# Patient Record
Sex: Male | Born: 1955 | Race: White | Hispanic: No | Marital: Married | Smoking: Never smoker
Health system: Southern US, Community
[De-identification: ages and names within clinical notes are randomized; demographics above are authoritative.]

## PROBLEM LIST (undated history)

## (undated) DIAGNOSIS — K760 Fatty (change of) liver, not elsewhere classified: Secondary | ICD-10-CM

## (undated) DIAGNOSIS — K5790 Diverticulosis of intestine, part unspecified, without perforation or abscess without bleeding: Secondary | ICD-10-CM

## (undated) DIAGNOSIS — Z973 Presence of spectacles and contact lenses: Secondary | ICD-10-CM

## (undated) DIAGNOSIS — R011 Cardiac murmur, unspecified: Secondary | ICD-10-CM

## (undated) DIAGNOSIS — J189 Pneumonia, unspecified organism: Secondary | ICD-10-CM

## (undated) DIAGNOSIS — I10 Essential (primary) hypertension: Secondary | ICD-10-CM

## (undated) DIAGNOSIS — K222 Esophageal obstruction: Secondary | ICD-10-CM

## (undated) DIAGNOSIS — Z8719 Personal history of other diseases of the digestive system: Secondary | ICD-10-CM

## (undated) DIAGNOSIS — M109 Gout, unspecified: Secondary | ICD-10-CM

## (undated) DIAGNOSIS — K219 Gastro-esophageal reflux disease without esophagitis: Secondary | ICD-10-CM

## (undated) DIAGNOSIS — F419 Anxiety disorder, unspecified: Secondary | ICD-10-CM

## (undated) DIAGNOSIS — J301 Allergic rhinitis due to pollen: Secondary | ICD-10-CM

## (undated) DIAGNOSIS — Z8709 Personal history of other diseases of the respiratory system: Secondary | ICD-10-CM

## (undated) DIAGNOSIS — E119 Type 2 diabetes mellitus without complications: Secondary | ICD-10-CM

## (undated) DIAGNOSIS — Z87442 Personal history of urinary calculi: Secondary | ICD-10-CM

## (undated) HISTORY — PX: TONSILLECTOMY: SUR1361

## (undated) HISTORY — PX: ESOPHAGEAL DILATION: SHX303

## (undated) HISTORY — PX: COLONOSCOPY: SHX174

---

## 2017-04-22 ENCOUNTER — Encounter (HOSPITAL_COMMUNITY): Payer: Self-pay

## 2017-04-22 ENCOUNTER — Emergency Department (HOSPITAL_COMMUNITY): Payer: BLUE CROSS/BLUE SHIELD

## 2017-04-22 ENCOUNTER — Emergency Department (HOSPITAL_COMMUNITY)
Admission: EM | Admit: 2017-04-22 | Discharge: 2017-04-22 | Disposition: A | Discharge status: Home / Self Care | Payer: BLUE CROSS/BLUE SHIELD | Attending: Emergency Medicine | Admitting: Emergency Medicine

## 2017-04-22 DIAGNOSIS — N200 Calculus of kidney: Secondary | ICD-10-CM | POA: Insufficient documentation

## 2017-04-22 DIAGNOSIS — R1032 Left lower quadrant pain: Secondary | ICD-10-CM | POA: Diagnosis present

## 2017-04-22 DIAGNOSIS — R319 Hematuria, unspecified: Secondary | ICD-10-CM | POA: Diagnosis not present

## 2017-04-22 DIAGNOSIS — R109 Unspecified abdominal pain: Secondary | ICD-10-CM

## 2017-04-22 HISTORY — DX: Allergic rhinitis due to pollen: J30.1

## 2017-04-22 HISTORY — DX: Esophageal obstruction: K22.2

## 2017-04-22 HISTORY — DX: Diverticulosis of intestine, part unspecified, without perforation or abscess without bleeding: K57.90

## 2017-04-22 LAB — URINALYSIS, ROUTINE W REFLEX MICROSCOPIC
Bilirubin Urine: NEGATIVE
Glucose, UA: NEGATIVE mg/dL
KETONES UR: 20 mg/dL — AB
NITRITE: NEGATIVE
PROTEIN: 30 mg/dL — AB
Specific Gravity, Urine: 1.014 (ref 1.005–1.030)
Squamous Epithelial / LPF: NONE SEEN
pH: 5 (ref 5.0–8.0)

## 2017-04-22 LAB — COMPREHENSIVE METABOLIC PANEL
ALK PHOS: 49 U/L (ref 38–126)
ALT: 95 U/L — AB (ref 17–63)
AST: 66 U/L — AB (ref 15–41)
Albumin: 4.2 g/dL (ref 3.5–5.0)
Anion gap: 11 (ref 5–15)
BUN: 15 mg/dL (ref 6–20)
CO2: 21 mmol/L — ABNORMAL LOW (ref 22–32)
CREATININE: 0.91 mg/dL (ref 0.61–1.24)
Calcium: 9.2 mg/dL (ref 8.9–10.3)
Chloride: 107 mmol/L (ref 101–111)
GFR calc non Af Amer: >60 mL/min (ref >60)
Glucose, Bld: 180 mg/dL — ABNORMAL HIGH (ref 65–99)
Potassium: 4.5 mmol/L (ref 3.5–5.1)
SODIUM: 139 mmol/L (ref 135–145)
Total Bilirubin: 1.2 mg/dL (ref 0.3–1.2)
Total Protein: 7.5 g/dL (ref 6.5–8.1)

## 2017-04-22 LAB — CBC WITH DIFFERENTIAL/PLATELET
BASOS ABS: 0 10*3/uL (ref 0.0–0.1)
Basophils Relative: 0 %
EOS ABS: 0 10*3/uL (ref 0.0–0.7)
EOS PCT: 0 %
HCT: 49.6 % (ref 39.0–52.0)
HEMOGLOBIN: 17 g/dL (ref 13.0–17.0)
LYMPHS ABS: 1 10*3/uL (ref 0.7–4.0)
LYMPHS PCT: 10 %
MCH: 30.2 pg (ref 26.0–34.0)
MCHC: 34.3 g/dL (ref 30.0–36.0)
MCV: 88.1 fL (ref 78.0–100.0)
Monocytes Absolute: 0.5 10*3/uL (ref 0.1–1.0)
Monocytes Relative: 5 %
NEUTROS PCT: 85 %
Neutro Abs: 8.2 10*3/uL — ABNORMAL HIGH (ref 1.7–7.7)
PLATELETS: 165 10*3/uL (ref 150–400)
RBC: 5.63 MIL/uL (ref 4.22–5.81)
RDW: 13.5 % (ref 11.5–15.5)
WBC: 9.8 10*3/uL (ref 4.0–10.5)

## 2017-04-22 LAB — LIPASE, BLOOD: LIPASE: 69 U/L — AB (ref 11–51)

## 2017-04-22 LAB — I-STAT CG4 LACTIC ACID, ED
LACTIC ACID, VENOUS: 1.75 mmol/L (ref 0.5–1.9)
LACTIC ACID, VENOUS: 1.75 mmol/L (ref 0.5–1.9)

## 2017-04-22 LAB — PROTIME-INR
INR: 1.09
Prothrombin Time: 14.2 seconds (ref 11.4–15.2)

## 2017-04-22 MED ORDER — TAMSULOSIN HCL 0.4 MG PO CAPS: 0.4000 mg | ORAL_CAPSULE | Freq: Every day | ORAL | 0 refills | Status: DC

## 2017-04-22 MED ORDER — IOPAMIDOL (ISOVUE-300) INJECTION 61%
100.0000 mL | Freq: Once | INTRAVENOUS | Status: AC | PRN
  Administered 2017-04-22: 100 mL via INTRAVENOUS

## 2017-04-22 MED ORDER — OXYCODONE HCL 5 MG PO TABS
5.0000 mg | ORAL_TABLET | Freq: Once | ORAL | Status: AC
  Administered 2017-04-22: 5 mg via ORAL
  Filled 2017-04-22: qty 1

## 2017-04-22 MED ORDER — OXYCODONE HCL 5 MG PO TABS: 5.0000 mg | ORAL_TABLET | ORAL | 0 refills | Status: DC | PRN

## 2017-04-22 MED ORDER — IOPAMIDOL (ISOVUE-300) INJECTION 61%
INTRAVENOUS | Status: AC
  Filled 2017-04-22: qty 100

## 2017-04-22 MED ORDER — ONDANSETRON HCL 4 MG PO TABS: 4.0000 mg | ORAL_TABLET | Freq: Three times a day (TID) | ORAL | 0 refills | Status: DC | PRN

## 2017-04-22 MED ORDER — SODIUM CHLORIDE 0.9 % IV BOLUS (SEPSIS)
1000.0000 mL | Freq: Once | INTRAVENOUS | Status: AC
  Administered 2017-04-22: 1000 mL via INTRAVENOUS

## 2017-04-22 MED ORDER — FENTANYL CITRATE (PF) 100 MCG/2ML IJ SOLN
50.0000 ug | Freq: Once | INTRAMUSCULAR | Status: AC
Start: 2017-04-22 — End: 2017-04-22
  Administered 2017-04-22: 50 ug via INTRAVENOUS
  Filled 2017-04-22: qty 2

## 2017-04-22 NOTE — Discharge Instructions (Signed)
Please take the pain medicine, nausea medicine, and Flomax to help with her symptoms and help pass the kidney stone. Please follow-up with your primary care physician for further management of your elevated liver function testing and your prostate size. Please follow-up with urology and strain your urine to catch the stone. If any symptoms change or worsen, please return to the nearest emergency department.

## 2017-04-22 NOTE — ED Notes (Signed)
ED Provider at bedside. 

## 2017-04-22 NOTE — ED Notes (Signed)
Bed: WA17 Expected date:  Expected time:  Means of arrival:  Comments: Hold for 13

## 2017-04-22 NOTE — ED Notes (Signed)
Pt given ginger ale.

## 2017-04-22 NOTE — ED Triage Notes (Signed)
He c/o left flank pain with "my urine is a little dark" x 2 days. He went to [facility] today; and while he was there he had a "bad pain flare" at left abd./flank area. The doctor there (Dr. Nicholos Johnseade) sent him to hispital with concern of kidney stone vs. Diverticulosis. He arrives in no distress, having rec'd. I.M. Phenergan and I.V. Fentanyl.

## 2017-04-22 NOTE — ED Notes (Signed)
Pt ambulatory to restroom

## 2017-04-22 NOTE — ED Provider Notes (Signed)
[facility]-EMERGENCY DEPT Provider Note   CSN: [id number] Arrival date & time: 04/22/17  1547     History   Chief Complaint Chief Complaint  Patient presents with  . Flank Pain    HPI Kevin Tanner is a 61 y.o. male.  The history is provided by the patient and medical records.  Abdominal Pain   This is a new problem. The current episode started 3 to 5 hours ago. The problem occurs constantly. The problem has been rapidly improving. The pain is located in the LLQ. The quality of the pain is aching and dull. The pain is at a severity of 7/10. The pain is moderate. Associated symptoms include nausea, vomiting and constipation. Pertinent negatives include fever, diarrhea, melena, dysuria, frequency and headaches. The symptoms are aggravated by palpation. Nothing relieves the symptoms.    Past Medical History:  Diagnosis Date  . Diverticulosis   . Esophageal stricture   . Hay fever     There are no active problems to display for this patient.   Past Surgical History:  Procedure Laterality Date  . COLONOSCOPY         Home Medications    Prior to Admission medications   Not on File    Family History No family history on file.  Social History Social History  Substance Use Topics  . Smoking status: Never Smoker  . Smokeless tobacco: Never Used  . Alcohol use Yes     Allergies   Patient has no known allergies.   Review of Systems Review of Systems  Constitutional: Positive for chills, diaphoresis and fatigue. Negative for fever.  HENT: Negative for congestion and rhinorrhea.   Eyes: Negative for visual disturbance.  Respiratory: Negative for cough, chest tightness, shortness of breath, wheezing and stridor.   Cardiovascular: Negative for chest pain, palpitations and leg swelling.  Gastrointestinal: Positive for abdominal pain, constipation, nausea and vomiting. Negative for diarrhea and melena.  Genitourinary: Positive for flank pain. Negative for dysuria and  frequency.  Musculoskeletal: Negative for back pain, neck pain and neck stiffness.  Skin: Negative for rash and wound.  Neurological: Negative for weakness, light-headedness, numbness and headaches.  Psychiatric/Behavioral: Negative for agitation.  All other systems reviewed and are negative.    Physical Exam Updated Vital Signs BP (!) 148/102 (BP Location: Left Arm)   Pulse 66   Temp 98 F (36.7 C) (Oral)   Resp 18   SpO2 100%   Physical Exam  Constitutional: He is oriented to person, place, and time. He appears well-developed and well-nourished. No distress.  HENT:  Head: Normocephalic and atraumatic.  Mouth/Throat: Oropharynx is clear and moist. No oropharyngeal exudate.  Eyes: Pupils are equal, round, and reactive to light. Conjunctivae and EOM are normal. No scleral icterus.  Neck: Normal range of motion. Neck supple.  Cardiovascular: Normal rate and regular rhythm.   No murmur heard. Pulmonary/Chest: Effort normal and breath sounds normal. No respiratory distress. He has no wheezes.  Abdominal: Soft. Normal appearance. He exhibits no distension. There is tenderness in the left upper quadrant and left lower quadrant. There is no rigidity, no guarding and no CVA tenderness.    Musculoskeletal: He exhibits tenderness. He exhibits no edema.       Thoracic back: He exhibits tenderness and pain. He exhibits no bony tenderness.       Back:  Neurological: He is alert and oriented to person, place, and time. No cranial nerve deficit or sensory deficit. He exhibits normal muscle tone.  Skin:  Skin is warm and dry. Capillary refill takes less than 2 seconds. He is not diaphoretic. No erythema.  Psychiatric: He has a normal mood and affect. His behavior is normal.  Nursing note and vitals reviewed.    ED Treatments / Results  Labs (all labs ordered are listed, but only abnormal results are displayed) Labs Reviewed  CBC WITH DIFFERENTIAL/PLATELET - Abnormal; Notable for the  following:       Result Value   Neutro Abs 8.2 (*)    All other components within normal limits  COMPREHENSIVE METABOLIC PANEL - Abnormal; Notable for the following:    CO2 21 (*)    Glucose, Bld 180 (*)    AST 66 (*)    ALT 95 (*)    All other components within normal limits  LIPASE, BLOOD - Abnormal; Notable for the following:    Lipase 69 (*)    All other components within normal limits  URINALYSIS, ROUTINE W REFLEX MICROSCOPIC - Abnormal; Notable for the following:    APPearance HAZY (*)    Hgb urine dipstick LARGE (*)    Ketones, ur 20 (*)    Protein, ur 30 (*)    Leukocytes, UA TRACE (*)    Bacteria, UA RARE (*)    All other components within normal limits  URINE CULTURE  PROTIME-INR  I-STAT CG4 LACTIC ACID, ED  I-STAT CG4 LACTIC ACID, ED    EKG  EKG Interpretation None       Radiology Ct Abdomen Pelvis W Contrast  Result Date: 04/22/2017 CLINICAL DATA:  Left flank pain. EXAM: CT ABDOMEN AND PELVIS WITH CONTRAST TECHNIQUE: Multidetector CT imaging of the abdomen and pelvis was performed using the standard protocol following bolus administration of intravenous contrast. CONTRAST:  <See Chart> ISOVUE-300 IOPAMIDOL (ISOVUE-300) INJECTION 61% COMPARISON:  None. FINDINGS: Lower chest: No acute findings. Hepatobiliary: Liver is low in density suggesting fatty infiltration. Multiple stones within the otherwise normal-appearing gallbladder. No bile duct dilatation seen. Pancreas: Unremarkable. No pancreatic ductal dilatation or surrounding inflammatory changes. Spleen: Normal in size without focal abnormality. Adrenals/Urinary Tract: Adrenal glands appear normal. 6 x 5 mm stone within the proximal left ureter causing mild left-sided hydronephrosis and perinephric edema. Right renal cyst measures 2.6 cm. Bladder is unremarkable. Stomach/Bowel: Bowel is normal in caliber. Extensive diverticulosis of the sigmoid colon without evidence of acute diverticulitis. Additional scattered  diverticulosis within the descending and transverse colon. Appendix is normal. Stomach appears normal. Probable small hiatal hernia. Vascular/Lymphatic: No significant vascular findings are present. No enlarged abdominal or pelvic lymph nodes. Reproductive: Prostate gland is moderately enlarged causing slight mass effect on the bladder base. Other: No abscess collection. No free intraperitoneal air. Small periumbilical abdominal wall hernia which contains fat only. Musculoskeletal: Mild degenerative change in the lumbar spine. No acute or suspicious osseous finding. IMPRESSION: 1. 6 x 5 mm stone within the proximal left ureter, L4 vertebral body level, causing mild left-sided hydronephrosis. 2. Colonic diverticulosis without evidence of acute diverticulitis. 3. Prostate gland is moderately enlarged causing slight mass effect on the bladder base. Recommend correlation with physical exam findings and/or PSA lab values. 4. Fatty infiltration of the liver. 5. Cholelithiasis without evidence of acute cholecystitis. 6. Probable small hiatal hernia. Electronically Signed   By: Bary Richard M.D.   On: 04/22/2017 18:51    Procedures Procedures (including critical care time)  Medications Ordered in ED Medications  sodium chloride 0.9 % bolus 1,000 mL (0 mLs Intravenous Stopped 04/22/17 1900)  iopamidol (ISOVUE-300) 61 %  injection 100 mL (100 mLs Intravenous Contrast Given 04/22/17 1832)  fentaNYL (SUBLIMAZE) injection 50 mcg (50 mcg Intravenous Given 04/22/17 1810)  oxyCODONE (Oxy IR/ROXICODONE) immediate release tablet 5 mg (5 mg Oral Given 04/22/17 2047)     Initial Impression / Assessment and Plan / ED Course  I have reviewed the triage vital signs and the nursing notes.  Pertinent labs & imaging results that were available during my care of the patient were reviewed by me and considered in my medical decision making (see chart for details).     Kevin Tanner is a 61 y.o. male with a past medical history  significant for diverticulosis who presents from his PCP office for left flank and left abdominal pain. Patient reports that he had darkened urine for the last day or 2. He also felt bloated over the last 2 days. He says after leaving church today at approximately 12:30 driving home, he experienced gradual onset of severe left abdominal and left flank pain. He describes the pain is radiating around the side his left flank. He reports several episodes of nausea and nonbloody/nonbilious vomiting. He reports that he had a very small bowel movement this morning and has had some constipation. He denies any rectal bleeding. He denies any groin pain, history of hernias, or traumatic injury. He denies dysuria or foul-smelling urine. He denies any history of kidney stones or prior diverticulitis. He does report a previous colonoscopy revealed diverticulosis. Patient was sent to the emergency department by his PCP for further evaluation.  Patient reports that he received Phenergan and fentanyl with significant improvement in pain. He reports his pain was a 7 out of 10 severity prior to treatment. He is currently 1 out of 10 severity. He denies fevers, chest pain, shortness of breath, cough, or other symptoms. He does say that he had some chills and got diaphoretic when his pain was severe.  On exam, left upper and left lower quadrant were tender to palpation. Left flank slightly tender. No CVA tenderness. Lungs were clear. Chest is nontender. Other abdominal areas nontender. No other significant abnormality is on exam. Symmetrical pulses in upper and lower extremity.   Patient will have screening laboratory testing and urinalysis as well as imaging to look for etiology of discomfort. Suspect constipation versus diverticulitis versus kidney stone versus obstruction. Patient will be given more symptomatic medications if needed while workup was initiated.  8:48 PM Patient given more pain medicine while in the  ED.  Diagnostic testing results are seen above. Lactic acid remained nonelevated. CBC shows no leukocytosis or anemia. CMP showed normal kidney function. Mild elevation of liver function testing. Also slight elevation in lipase. Her analysis showed large hematuria with no nitrates. Given lack of urinary symptoms, doubt UTI. Culture was sent.  CT scan showed kidney stone. A 6 mm x 5 mm. There is mild hydronephrosis. No evidence of diverticulitis or acute cholecystitis. Diverticuli present and gallstones present. Also fatty liver seen which is likely the cause of his LFT elevation. Large prostate seen.  Patient informed of all findings on imaging. Patient will follow-up with PCP for his liver function, fatty liver, and prostate size.  Given the size of the stone and current location mid ureter, patient will be given a trial of medical convulsion therapy with pain medicine, nausea medicine, fluids, and Flomax. Patient will be instructed to strain his urine and follow-up with urology.  Patient was able to tolerate oral food and fluids during challenge. Patient is feeling much better.  Patient given dose of oral pain medicine and agreed with management plan. Patient understood return precautions and follow-up instructions. Patient had no other questions or concerns and was discharged in good condition with plan for trial of outpatient medical expulsion therapy of his kidney stone.   Final Clinical Impressions(s) / ED Diagnoses   Final diagnoses:  Left flank pain  Hematuria, unspecified type  Kidney stone    New Prescriptions New Prescriptions   ONDANSETRON (ZOFRAN) 4 MG TABLET    Take 1 tablet (4 mg total) by mouth every 8 (eight) hours as needed for nausea or vomiting.   OXYCODONE (ROXICODONE) 5 MG IMMEDIATE RELEASE TABLET    Take 1 tablet (5 mg total) by mouth every 4 (four) hours as needed for severe pain.   TAMSULOSIN (FLOMAX) 0.4 MG CAPS CAPSULE    Take 1 capsule (0.4 mg total) by mouth  daily.    Clinical Impression: 1. Left flank pain   2. Hematuria, unspecified type   3. Kidney stone     Disposition: Discharge  Condition: Good  I have discussed the results, Dx and Tx plan with the pt(& family if present). He/she/they expressed understanding and agree(s) with the plan. Discharge instructions discussed at great length. Strict return precautions discussed and pt &/or family have verbalized understanding of the instructions. No further questions at time of discharge.    New Prescriptions   ONDANSETRON (ZOFRAN) 4 MG TABLET    Take 1 tablet (4 mg total) by mouth every 8 (eight) hours as needed for nausea or vomiting.   OXYCODONE (ROXICODONE) 5 MG IMMEDIATE RELEASE TABLET    Take 1 tablet (5 mg total) by mouth every 4 (four) hours as needed for severe pain.   TAMSULOSIN (FLOMAX) 0.4 MG CAPS CAPSULE    Take 1 capsule (0.4 mg total) by mouth daily.    Follow Up: [facility] [address] [place] [id number] [phone] Schedule an appointment as soon as possible for a visit    [facility] [id number] [place] [id number] [phone]  If symptoms worsen     Neytiri Asche, Canary Brimhristopher J, MD 04/23/17 [phone]

## 2017-04-22 NOTE — ED Notes (Signed)
Bed: [id number] Expected date:  Expected time:  Means of arrival:  Comments: 61 yo abd pain

## 2017-04-22 NOTE — ED Notes (Addendum)
Patient transported to CT 

## 2017-04-24 LAB — URINE CULTURE: CULTURE: NO GROWTH

## 2017-05-08 ENCOUNTER — Other Ambulatory Visit: Payer: Self-pay | Admitting: Urology

## 2017-05-09 ENCOUNTER — Other Ambulatory Visit: Payer: Self-pay | Admitting: Urology

## 2017-05-14 ENCOUNTER — Encounter (HOSPITAL_COMMUNITY): Payer: Self-pay | Admitting: *Deleted

## 2017-05-17 ENCOUNTER — Ambulatory Visit (HOSPITAL_COMMUNITY): Payer: BLUE CROSS/BLUE SHIELD

## 2017-05-17 ENCOUNTER — Ambulatory Visit (HOSPITAL_COMMUNITY)
Admission: RE | Admit: 2017-05-17 | Discharge: 2017-05-17 | Disposition: A | Discharge status: Home / Self Care | Payer: BLUE CROSS/BLUE SHIELD | Source: Ambulatory Visit | Attending: Urology | Admitting: Urology

## 2017-05-17 ENCOUNTER — Encounter (HOSPITAL_COMMUNITY): Payer: Self-pay | Admitting: *Deleted

## 2017-05-17 ENCOUNTER — Encounter (HOSPITAL_COMMUNITY)
Admission: RE | Disposition: A | Discharge status: Home / Self Care | Payer: Self-pay | Source: Ambulatory Visit | Attending: Urology

## 2017-05-17 DIAGNOSIS — N201 Calculus of ureter: Secondary | ICD-10-CM

## 2017-05-17 DIAGNOSIS — Z7982 Long term (current) use of aspirin: Secondary | ICD-10-CM | POA: Insufficient documentation

## 2017-05-17 HISTORY — DX: Personal history of other diseases of the digestive system: Z87.19

## 2017-05-17 HISTORY — DX: Personal history of urinary calculi: Z87.442

## 2017-05-17 HISTORY — PX: EXTRACORPOREAL SHOCK WAVE LITHOTRIPSY: SHX1557

## 2017-05-17 HISTORY — DX: Cardiac murmur, unspecified: R01.1

## 2017-05-17 HISTORY — DX: Personal history of other diseases of the respiratory system: Z87.09

## 2017-05-17 HISTORY — DX: Pneumonia, unspecified organism: J18.9

## 2017-05-17 SURGERY — LITHOTRIPSY, ESWL
Anesthesia: LOCAL | Laterality: Left

## 2017-05-17 MED ORDER — DIPHENHYDRAMINE HCL 25 MG PO CAPS
25.0000 mg | ORAL_CAPSULE | ORAL | Status: AC
  Administered 2017-05-17: 25 mg via ORAL
  Filled 2017-05-17: qty 1

## 2017-05-17 MED ORDER — DIAZEPAM 5 MG PO TABS
10.0000 mg | ORAL_TABLET | ORAL | Status: AC
  Administered 2017-05-17: 10 mg via ORAL
  Filled 2017-05-17: qty 2

## 2017-05-17 MED ORDER — SODIUM CHLORIDE 0.9 % IV SOLN
INTRAVENOUS | Status: DC
  Administered 2017-05-17: 07:00:00 via INTRAVENOUS

## 2017-05-17 MED ORDER — CIPROFLOXACIN HCL 500 MG PO TABS
500.0000 mg | ORAL_TABLET | ORAL | Status: AC
  Administered 2017-05-17: 500 mg via ORAL
  Filled 2017-05-17: qty 1

## 2017-05-17 NOTE — Discharge Instructions (Signed)
Moderate Conscious Sedation, Adult, Care After  These instructions provide you with information about caring for yourself after your procedure. Your health care provider may also give you more specific instructions. Your treatment has been planned according to current medical practices, but problems sometimes occur. Call your health care provider if you have any problems or questions after your procedure.  What can I expect after the procedure?  After your procedure, it is common:   To feel sleepy for several hours.   To feel clumsy and have poor balance for several hours.   To have poor judgment for several hours.   To vomit if you eat too soon.    Follow these instructions at home:  For at least 24 hours after the procedure:     Do not:  ? Participate in activities where you could fall or become injured.  ? Drive.  ? Use heavy machinery.  ? Drink alcohol.  ? Take sleeping pills or medicines that cause drowsiness.  ? Make important decisions or sign legal documents.  ? Take care of children on your own.   Rest.  Eating and drinking   Follow the diet recommended by your health care provider.   If you vomit:  ? Drink water, juice, or soup when you can drink without vomiting.  ? Make sure you have little or no nausea before eating solid foods.  General instructions   Have a responsible adult stay with you until you are awake and alert.   Take over-the-counter and prescription medicines only as told by your health care provider.   If you smoke, do not smoke without supervision.   Keep all follow-up visits as told by your health care provider. This is important.  Contact a health care provider if:   You keep feeling nauseous or you keep vomiting.   You feel light-headed.   You develop a rash.   You have a fever.  Get help right away if:   You have trouble breathing.  This information is not intended to replace advice given to you by your health care provider. Make sure you discuss any questions you have  with your health care provider.  Document Released: 06/25/2013 Document Revised: 02/07/2016 Document Reviewed: 12/25/2015  Elsevier Interactive Patient Education  2018 [employer]

## 2017-05-17 NOTE — Progress Notes (Signed)
Lithotripsy canceled due to elevated blood pressure per Dr. Ronne BinningMcKenzie . Instructed to follow up with primary care MD. Mr. Kevin Tanner verbalized understanding.

## 2017-05-22 ENCOUNTER — Other Ambulatory Visit: Payer: Self-pay | Admitting: Urology

## 2017-05-23 ENCOUNTER — Encounter (HOSPITAL_COMMUNITY): Payer: Self-pay | Admitting: *Deleted

## 2017-05-24 ENCOUNTER — Ambulatory Visit (HOSPITAL_COMMUNITY)
Admission: RE | Admit: 2017-05-24 | Discharge: 2017-05-24 | Disposition: A | Discharge status: Home / Self Care | Payer: BLUE CROSS/BLUE SHIELD | Source: Ambulatory Visit | Attending: Urology | Admitting: Urology

## 2017-05-24 ENCOUNTER — Encounter (HOSPITAL_COMMUNITY)
Admission: RE | Disposition: A | Discharge status: Home / Self Care | Payer: Self-pay | Source: Ambulatory Visit | Attending: Urology

## 2017-05-24 ENCOUNTER — Encounter (HOSPITAL_COMMUNITY): Payer: Self-pay | Admitting: General Practice

## 2017-05-24 ENCOUNTER — Ambulatory Visit (HOSPITAL_COMMUNITY): Payer: BLUE CROSS/BLUE SHIELD

## 2017-05-24 DIAGNOSIS — Z87442 Personal history of urinary calculi: Secondary | ICD-10-CM | POA: Insufficient documentation

## 2017-05-24 DIAGNOSIS — E119 Type 2 diabetes mellitus without complications: Secondary | ICD-10-CM | POA: Diagnosis not present

## 2017-05-24 DIAGNOSIS — E669 Obesity, unspecified: Secondary | ICD-10-CM | POA: Insufficient documentation

## 2017-05-24 DIAGNOSIS — N201 Calculus of ureter: Secondary | ICD-10-CM | POA: Diagnosis not present

## 2017-05-24 DIAGNOSIS — I10 Essential (primary) hypertension: Secondary | ICD-10-CM | POA: Diagnosis not present

## 2017-05-24 DIAGNOSIS — Z6835 Body mass index (BMI) 35.0-35.9, adult: Secondary | ICD-10-CM | POA: Diagnosis not present

## 2017-05-24 HISTORY — PX: EXTRACORPOREAL SHOCK WAVE LITHOTRIPSY: SHX1557

## 2017-05-24 HISTORY — DX: Type 2 diabetes mellitus without complications: E11.9

## 2017-05-24 HISTORY — DX: Essential (primary) hypertension: I10

## 2017-05-24 LAB — GLUCOSE, CAPILLARY: GLUCOSE-CAPILLARY: 122 mg/dL — AB (ref 65–99)

## 2017-05-24 SURGERY — LITHOTRIPSY, ESWL
Anesthesia: LOCAL | Laterality: Left

## 2017-05-24 MED ORDER — DIPHENHYDRAMINE HCL 25 MG PO CAPS
25.0000 mg | ORAL_CAPSULE | ORAL | Status: AC
Start: 2017-05-24 — End: 2017-05-24
  Administered 2017-05-24: 25 mg via ORAL
  Filled 2017-05-24: qty 1

## 2017-05-24 MED ORDER — CIPROFLOXACIN HCL 500 MG PO TABS
500.0000 mg | ORAL_TABLET | ORAL | Status: AC
  Administered 2017-05-24: 500 mg via ORAL
  Filled 2017-05-24 (×2): qty 1

## 2017-05-24 MED ORDER — SODIUM CHLORIDE 0.9 % IV SOLN
INTRAVENOUS | Status: DC
  Administered 2017-05-24: 15:00:00 via INTRAVENOUS

## 2017-05-24 MED ORDER — DIAZEPAM 5 MG PO TABS
10.0000 mg | ORAL_TABLET | ORAL | Status: AC
  Administered 2017-05-24: 10 mg via ORAL
  Filled 2017-05-24: qty 2

## 2017-05-24 NOTE — Progress Notes (Signed)
Procedure canceled due to stone in bladder. Patient instructed to drink lots of fluids to pass stone, follow-up with doctor with any problems, patient and patient's wife verbalized understanding.

## 2017-06-12 ENCOUNTER — Other Ambulatory Visit: Payer: Self-pay | Admitting: Urology

## 2017-06-14 HISTORY — PX: CYSTOSCOPY: SUR368

## 2017-06-18 ENCOUNTER — Other Ambulatory Visit: Payer: Self-pay | Admitting: Urology

## 2017-06-19 NOTE — H&P (Signed)
Urology Admission H&P  Chief Complaint: left flank pain  History of Present Illness: [name] is a 61yo with a hx of left ureteral calculus who has failed medical expulsive therapy  Past Medical History:  Diagnosis Date  . Diabetes mellitus without complication (HCC)    not on medication yet- just got told  05/24/17  . Diverticulosis   . Esophageal stricture   . Hay fever   . Heart murmur    grade 1  . History of bronchitis   . History of hiatal hernia   . History of kidney stones   . Hypertension    not on med yet  . Pneumonia    bacterial in early 20's    Past Surgical History:  Procedure Laterality Date  . COLONOSCOPY    . EXTRACORPOREAL SHOCK WAVE LITHOTRIPSY Left 05/17/2017   Procedure: LEFT EXTRACORPOREAL SHOCK WAVE LITHOTRIPSY (ESWL);  Surgeon: Malen Gauze, MD;  Location: [facility];  Service: Urology;  Laterality: Left;  . EXTRACORPOREAL SHOCK WAVE LITHOTRIPSY Left 05/24/2017   Procedure: LEFT EXTRACORPOREAL SHOCK WAVE LITHOTRIPSY (ESWL);  Surgeon: Malen Gauze, MD;  Location: [facility];  Service: Urology;  Laterality: Left;  . TONSILLECTOMY     age 61    Home Medications:  No prescriptions prior to admission.   Allergies: No Known Allergies  History reviewed. No pertinent family history. Social History:  reports that he has never smoked. He has never used smokeless tobacco. He reports that he drinks alcohol. He reports that he does not use drugs.  Review of Systems  Genitourinary: Positive for flank pain.  All other systems reviewed and are negative.   Physical Exam:  Vital signs in last 24 hours:   Physical Exam  Constitutional: He is oriented to person, place, and time. He appears well-developed and well-nourished.  HENT:  Head: Normocephalic and atraumatic.  Eyes: Pupils are equal, round, and reactive to light. EOM are normal.  Neck: Normal range of motion. No thyromegaly present.  Cardiovascular: Normal rate and regular rhythm.   Respiratory:  Effort normal. No respiratory distress.  GI: Soft. He exhibits no distension.  Musculoskeletal: Normal range of motion. He exhibits no edema.  Neurological: He is alert and oriented to person, place, and time.  Skin: Skin is warm and dry.  Psychiatric: He has a normal mood and affect. His behavior is normal. Judgment and thought content normal.    Laboratory Data:  No results found for this or any previous visit (from the past 24 hour(s)). No results found for this or any previous visit (from the past 240 hour(s)). Creatinine: No results for input(s): CREATININE in the last 168 hours. Baseline Creatinine: unknwon  Impression/Assessment:  61yo with left ureteral calculus  Plan:  The risks/benefits/alternatives to ESWL was explained to the patient and he understands and wishes to proceed with surgery  [name] 06/19/2017, 7:46 AM

## 2017-06-21 ENCOUNTER — Encounter (HOSPITAL_BASED_OUTPATIENT_CLINIC_OR_DEPARTMENT_OTHER): Payer: Self-pay | Admitting: *Deleted

## 2017-06-25 ENCOUNTER — Other Ambulatory Visit: Payer: Self-pay

## 2017-06-25 ENCOUNTER — Encounter (HOSPITAL_BASED_OUTPATIENT_CLINIC_OR_DEPARTMENT_OTHER): Payer: Self-pay | Admitting: Anesthesiology

## 2017-06-25 ENCOUNTER — Encounter (HOSPITAL_BASED_OUTPATIENT_CLINIC_OR_DEPARTMENT_OTHER)
Admission: RE | Disposition: A | Discharge status: Home / Self Care | Payer: Self-pay | Source: Ambulatory Visit | Attending: Urology

## 2017-06-25 ENCOUNTER — Ambulatory Visit (HOSPITAL_BASED_OUTPATIENT_CLINIC_OR_DEPARTMENT_OTHER): Payer: BLUE CROSS/BLUE SHIELD | Admitting: Anesthesiology

## 2017-06-25 ENCOUNTER — Ambulatory Visit (HOSPITAL_BASED_OUTPATIENT_CLINIC_OR_DEPARTMENT_OTHER)
Admission: RE | Admit: 2017-06-25 | Discharge: 2017-06-25 | Disposition: A | Discharge status: Home / Self Care | Payer: BLUE CROSS/BLUE SHIELD | Source: Ambulatory Visit | Attending: Urology | Admitting: Urology

## 2017-06-25 DIAGNOSIS — N401 Enlarged prostate with lower urinary tract symptoms: Secondary | ICD-10-CM | POA: Diagnosis present

## 2017-06-25 DIAGNOSIS — N210 Calculus in bladder: Secondary | ICD-10-CM | POA: Insufficient documentation

## 2017-06-25 DIAGNOSIS — F419 Anxiety disorder, unspecified: Secondary | ICD-10-CM | POA: Insufficient documentation

## 2017-06-25 DIAGNOSIS — N138 Other obstructive and reflux uropathy: Secondary | ICD-10-CM | POA: Insufficient documentation

## 2017-06-25 DIAGNOSIS — Z7984 Long term (current) use of oral hypoglycemic drugs: Secondary | ICD-10-CM | POA: Diagnosis not present

## 2017-06-25 DIAGNOSIS — R338 Other retention of urine: Secondary | ICD-10-CM | POA: Insufficient documentation

## 2017-06-25 DIAGNOSIS — Z87442 Personal history of urinary calculi: Secondary | ICD-10-CM | POA: Insufficient documentation

## 2017-06-25 DIAGNOSIS — E119 Type 2 diabetes mellitus without complications: Secondary | ICD-10-CM | POA: Insufficient documentation

## 2017-06-25 DIAGNOSIS — K219 Gastro-esophageal reflux disease without esophagitis: Secondary | ICD-10-CM | POA: Diagnosis not present

## 2017-06-25 DIAGNOSIS — I10 Essential (primary) hypertension: Secondary | ICD-10-CM | POA: Diagnosis not present

## 2017-06-25 DIAGNOSIS — Z79899 Other long term (current) drug therapy: Secondary | ICD-10-CM | POA: Insufficient documentation

## 2017-06-25 DIAGNOSIS — R011 Cardiac murmur, unspecified: Secondary | ICD-10-CM | POA: Diagnosis not present

## 2017-06-25 HISTORY — PX: CYSTOSCOPY WITH LITHOLAPAXY: SHX1425

## 2017-06-25 HISTORY — DX: Anxiety disorder, unspecified: F41.9

## 2017-06-25 HISTORY — PX: CYSTOSCOPY WITH INSERTION OF UROLIFT: SHX6678

## 2017-06-25 HISTORY — PX: CYSTOSCOPY: SHX5120

## 2017-06-25 HISTORY — PX: HOLMIUM LASER APPLICATION: SHX5852

## 2017-06-25 HISTORY — DX: Gastro-esophageal reflux disease without esophagitis: K21.9

## 2017-06-25 LAB — POCT I-STAT, CHEM 8
BUN: 16 mg/dL (ref 6–20)
CALCIUM ION: 1.19 mmol/L (ref 1.15–1.40)
CHLORIDE: 102 mmol/L (ref 101–111)
CREATININE: 0.9 mg/dL (ref 0.61–1.24)
GLUCOSE: 126 mg/dL — AB (ref 65–99)
HCT: 53 % — ABNORMAL HIGH (ref 39.0–52.0)
Hemoglobin: 18 g/dL — ABNORMAL HIGH (ref 13.0–17.0)
Potassium: 3.8 mmol/L (ref 3.5–5.1)
SODIUM: 142 mmol/L (ref 135–145)
TCO2: 27 mmol/L (ref 22–32)

## 2017-06-25 LAB — GLUCOSE, CAPILLARY: Glucose-Capillary: 154 mg/dL — ABNORMAL HIGH (ref 65–99)

## 2017-06-25 SURGERY — CYSTOSCOPY, WITH BLADDER CALCULUS LITHOLAPAXY
Anesthesia: General

## 2017-06-25 MED ORDER — LACTATED RINGERS IV SOLN
INTRAVENOUS | Status: DC
  Administered 2017-06-25: 14:00:00 via INTRAVENOUS
  Filled 2017-06-25: qty 1000

## 2017-06-25 MED ORDER — PROMETHAZINE HCL 25 MG/ML IJ SOLN
6.2500 mg | INTRAMUSCULAR | Status: DC | PRN
  Filled 2017-06-25: qty 1

## 2017-06-25 MED ORDER — MIDAZOLAM HCL 2 MG/2ML IJ SOLN
INTRAMUSCULAR | Status: DC | PRN
  Administered 2017-06-25: 2 mg via INTRAVENOUS

## 2017-06-25 MED ORDER — KETOROLAC TROMETHAMINE 30 MG/ML IJ SOLN
INTRAMUSCULAR | Status: DC | PRN
  Administered 2017-06-25: 30 mg via INTRAVENOUS

## 2017-06-25 MED ORDER — OXYCODONE HCL 5 MG/5ML PO SOLN
5.0000 mg | Freq: Once | ORAL | Status: AC | PRN
  Filled 2017-06-25: qty 5

## 2017-06-25 MED ORDER — OXYCODONE HCL 5 MG PO TABS: 5.0000 mg | ORAL_TABLET | ORAL | 0 refills | Status: DC | PRN

## 2017-06-25 MED ORDER — ONDANSETRON HCL 4 MG/2ML IJ SOLN
INTRAMUSCULAR | Status: DC | PRN
  Administered 2017-06-25: 4 mg via INTRAVENOUS

## 2017-06-25 MED ORDER — FENTANYL CITRATE (PF) 100 MCG/2ML IJ SOLN
INTRAMUSCULAR | Status: DC | PRN
  Administered 2017-06-25 (×2): 50 ug via INTRAVENOUS

## 2017-06-25 MED ORDER — PROPOFOL 10 MG/ML IV BOLUS
INTRAVENOUS | Status: DC | PRN
  Administered 2017-06-25: 200 mg via INTRAVENOUS

## 2017-06-25 MED ORDER — CEFAZOLIN SODIUM-DEXTROSE 2-4 GM/100ML-% IV SOLN
2.0000 g | INTRAVENOUS | Status: AC
  Administered 2017-06-25: 2 g via INTRAVENOUS
  Filled 2017-06-25: qty 100

## 2017-06-25 MED ORDER — HYDROMORPHONE HCL 1 MG/ML IJ SOLN
0.2500 mg | INTRAMUSCULAR | Status: DC | PRN
  Filled 2017-06-25: qty 0.5

## 2017-06-25 MED ORDER — DEXAMETHASONE SODIUM PHOSPHATE 10 MG/ML IJ SOLN
INTRAMUSCULAR | Status: DC | PRN
  Administered 2017-06-25: 10 mg via INTRAVENOUS

## 2017-06-25 MED ORDER — LIDOCAINE 2% (20 MG/ML) 5 ML SYRINGE
INTRAMUSCULAR | Status: DC | PRN
  Administered 2017-06-25: 80 mg via INTRAVENOUS

## 2017-06-25 MED ORDER — OXYCODONE HCL 5 MG PO TABS
5.0000 mg | ORAL_TABLET | Freq: Once | ORAL | Status: AC | PRN
  Administered 2017-06-25: 5 mg via ORAL
  Filled 2017-06-25: qty 1

## 2017-06-25 SURGICAL SUPPLY — 33 items
ADAPTER CATH WHT DISP STRL (CATHETERS) IMPLANT
BAG DRAIN URO-CYSTO SKYTR STRL (DRAIN) ×3 IMPLANT
BAG URINE DRAINAGE (UROLOGICAL SUPPLIES) IMPLANT
BAG URINE LEG 19OZ MD ST LTX (BAG) ×3 IMPLANT
CATH FOLEY 2WAY SLVR  5CC 16FR (CATHETERS)
CATH FOLEY 2WAY SLVR  5CC 18FR (CATHETERS) ×2
CATH FOLEY 2WAY SLVR 5CC 16FR (CATHETERS) IMPLANT
CATH FOLEY 2WAY SLVR 5CC 18FR (CATHETERS) ×1 IMPLANT
CLOTH BEACON ORANGE TIMEOUT ST (SAFETY) ×3 IMPLANT
ELECT REM PT RETURN 9FT ADLT (ELECTROSURGICAL)
ELECTRODE REM PT RTRN 9FT ADLT (ELECTROSURGICAL) IMPLANT
EVACUATOR MICROVAS BLADDER (UROLOGICAL SUPPLIES) IMPLANT
FIBER LASER FLEXIVA 1000 (UROLOGICAL SUPPLIES) IMPLANT
FIBER LASER FLEXIVA 200 (UROLOGICAL SUPPLIES) IMPLANT
FIBER LASER FLEXIVA 365 (UROLOGICAL SUPPLIES) IMPLANT
FIBER LASER FLEXIVA 550 (UROLOGICAL SUPPLIES) IMPLANT
GLOVE BIO SURGEON STRL SZ8 (GLOVE) ×3 IMPLANT
GOWN STRL REUS W/ TWL LRG LVL3 (GOWN DISPOSABLE) ×1 IMPLANT
GOWN STRL REUS W/ TWL XL LVL3 (GOWN DISPOSABLE) ×1 IMPLANT
GOWN STRL REUS W/TWL LRG LVL3 (GOWN DISPOSABLE) ×2
GOWN STRL REUS W/TWL XL LVL3 (GOWN DISPOSABLE) ×5 IMPLANT
HOLDER FOLEY CATH W/STRAP (MISCELLANEOUS) ×3 IMPLANT
IV NS IRRIG 3000ML ARTHROMATIC (IV SOLUTION) IMPLANT
KIT RM TURNOVER CYSTO AR (KITS) ×3 IMPLANT
MANIFOLD NEPTUNE II (INSTRUMENTS) ×3 IMPLANT
NEEDLE SPNL 22GX7 QUINCKE BK (NEEDLE) IMPLANT
NS IRRIG 500ML POUR BTL (IV SOLUTION) IMPLANT
PACK CYSTO (CUSTOM PROCEDURE TRAY) ×3 IMPLANT
SYR 20CC LL (SYRINGE) ×6 IMPLANT
SYSTEM UROLIFT (Male Continence) ×15 IMPLANT
TUBE CONNECTING 12'X1/4 (SUCTIONS) ×1
TUBE CONNECTING 12X1/4 (SUCTIONS) ×2 IMPLANT
WATER STERILE IRR 3000ML UROMA (IV SOLUTION) ×6 IMPLANT

## 2017-06-25 NOTE — Op Note (Signed)
   PREOPERATIVE DIAGNOSIS: Benign prostatic hypertrophy with bladder outlet obstruction. Bladder calculus  POSTOPERATIVE DIAGNOSIS: Benign prostatic hypertrophy with bladder outlet obstruction. Bladder calculus  PROCEDURE:1. Cystoscopy with implantation of UroLift devices, 5 implants. 2. cystolithalopaxy for a stone less than 2.5cm  SURGEON: [name], M.D.  ANESTHESIA: General  ANTIBIOTICS: ancef  SPECIMEN: None.  DRAINS: A 18-French Foley catheter.  BLOOD LOSS: Minimal.  COMPLICATIONS: None.  INDICATIONS:The Patient is an 61 year old white male with BPH and bladder outlet obstruction. He has a bladder calculus also He has failed medical therapy and has elected UroLift for definitive treatment as well as cystolithalopaxy  FINDINGS OF PROCEDURE: He was taken to the operating room where a genral anesthetic was induced. He was placed in lithotomy position and was fitted with PAS hose. His perineum and genitalia were prepped with chlorhexidine, and he was draped in usual sterile fashion.  Cystoscopy was performed using the UroLift scope and 0 degree lens. Examination revealed a normal urethra. The external sphincter was intact. Prostatic urethra was approximately 4 cm in length with lateral lobe enlargement. There was also little bit of bladder neck elevation. Inspection of bladder revealed mild-to-moderate trabeculation with no tumors, or inflammation. No cellules or diverticula were noted. Ureteral orifices were in their normal anatomic position effluxing clear Urine. Using a grasper the bladder calculus was manipulated and pulled fromt he bladder.  After initial cystoscopy, the visual obturator was replaced with the first UroLift device. This was turned to the 9 o'clock position and pulled back to the veru and then slightly advanced. Pressure was then applied to the right lateral lobe and the UroLift device was deployed.  The second UroLift  device was then inserted and applied to the left lateral lobe at 3 o'clock and deployed in the mid prostatic urethra. After this, there was still some apparent obstruction closer to the bladder neck. So a second level of UroLift your left device was applied between the mid urethra and the proximal urethra providing further patency to the prostatic urethra. At this point, there was mild bleeding but the patient did have a spinal anesthetic. So it was thought that a Foley catheter was indicated. The scope was removed and a 16-French Foley catheter was inserted without difficulty. The balloon was filled with 10 mL sterile fluid, and the catheter was placed to straight drainage.  COMPLICATIONS: None   CONDITION: Stable, extubated, transferred to PACU  PLAN: The patient will be discharged home and followup in 2 days for a voiding trial.

## 2017-06-25 NOTE — Anesthesia Procedure Notes (Signed)
Procedure Name: LMA Insertion Date/Time: 06/25/2017 2:39 PM Performed by: Tyrone Nine Pre-anesthesia Checklist: Patient identified, Timeout performed, Emergency Drugs available, Suction available and Patient being monitored Patient Re-evaluated:Patient Re-evaluated prior to induction Oxygen Delivery Method: Circle system utilized Preoxygenation: Pre-oxygenation with 100% oxygen Induction Type: IV induction Ventilation: Mask ventilation without difficulty LMA: LMA inserted LMA Size: 5.0 Placement Confirmation: positive ETCO2 and breath sounds checked- equal and bilateral Tube secured with: Tape Dental Injury: Teeth and Oropharynx as per pre-operative assessment

## 2017-06-25 NOTE — Transfer of Care (Signed)
Immediate Anesthesia Transfer of Care Note  Patient: Kevin Tanner  Procedure(s) Performed: CYSTOSCOPY WITH LITHOLAPAXY (N/A ) HOLMIUM LASER APPLICATION (N/A ) CYSTOSCOPY (N/A ) CYSTOSCOPY WITH INSERTION OF UROLIFT (N/A )  Patient Location: PACU  Anesthesia Type:General  Level of Consciousness: awake, alert , oriented and patient cooperative  Airway & Oxygen Therapy: Patient Spontanous Breathing and Patient connected to nasal cannula oxygen  Post-op Assessment: Report given to RN and Post -op Vital signs reviewed and stable  Post vital signs: Reviewed and stable  Last Vitals:  Vitals:   06/25/17 1246  BP: (!) 141/97  Pulse: 93  Resp: 19  Temp: 36.7 C  SpO2: 96%    Last Pain:  Vitals:   06/25/17 1246  TempSrc: Oral         Complications: No apparent anesthesia complications

## 2017-06-25 NOTE — Anesthesia Preprocedure Evaluation (Addendum)
Anesthesia Evaluation  Patient identified by MRN, date of birth, ID band Patient awake    Reviewed: Allergy & Precautions, NPO status , Patient's Chart, lab work & pertinent test results  Airway Mallampati: II  TM Distance: >3 FB Neck ROM: Full    Dental no notable dental hx. (+) Teeth Intact, Dental Advisory Given   Pulmonary neg pulmonary ROS,    Pulmonary exam normal breath sounds clear to auscultation       Cardiovascular Exercise Tolerance: Good hypertension, Pt. on medications negative cardio ROS Normal cardiovascular exam+ Valvular Problems/Murmurs  Rhythm:Regular Rate:Normal     Neuro/Psych Anxiety negative neurological ROS  negative psych ROS   GI/Hepatic negative GI ROS, Neg liver ROS, GERD  Medicated and Controlled,  Endo/Other  negative endocrine ROSdiabetes, Type 2, Oral Hypoglycemic Agents  Renal/GU negative Renal ROS  negative genitourinary   Musculoskeletal negative musculoskeletal ROS (+)   Abdominal   Peds negative pediatric ROS (+)  Hematology negative hematology ROS (+)   Anesthesia Other Findings   Reproductive/Obstetrics negative OB ROS                           Anesthesia Physical Anesthesia Plan  ASA: III  Anesthesia Plan: General   Post-op Pain Management:    Induction: Intravenous  PONV Risk Score and Plan: 2 and Ondansetron and Midazolam  Airway Management Planned: LMA  Additional Equipment:   Intra-op Plan:   Post-operative Plan: Extubation in OR  Informed Consent: I have reviewed the patients History and Physical, chart, labs and discussed the procedure including the risks, benefits and alternatives for the proposed anesthesia with the patient or authorized representative who has indicated his/her understanding and acceptance.   Dental advisory given  Plan Discussed with: CRNA  Anesthesia Plan Comments:         Anesthesia Quick  Evaluation

## 2017-06-25 NOTE — Discharge Instructions (Signed)

## 2017-06-25 NOTE — H&P (Signed)
Urology Admission H&P  Chief Complaint: bladder calculus  History of Present Illness: Kevin Tanner is a 61yo with BPH and incomplete emptying. His has a bladder calculus which he has been unable to pass  Past Medical History:  Diagnosis Date  . Anxiety   . Diabetes mellitus without complication (HCC)    type 2   . Diverticulosis   . Esophageal stricture   . GERD (gastroesophageal reflux disease)   . Hay fever   . Heart murmur    grade 1  . History of bronchitis   . History of hiatal hernia   . History of kidney stones   . Hypertension    not on med yet  . Pneumonia    bacterial in early 20's    Past Surgical History:  Procedure Laterality Date  . COLONOSCOPY    . CYSTOSCOPY  06/14/2017   in office  . EXTRACORPOREAL SHOCK WAVE LITHOTRIPSY Left 05/17/2017   Procedure: LEFT EXTRACORPOREAL SHOCK WAVE LITHOTRIPSY (ESWL);  Surgeon: Malen Gauze, MD;  Location: [facility];  Service: Urology;  Laterality: Left;  . EXTRACORPOREAL SHOCK WAVE LITHOTRIPSY Left 05/24/2017   Procedure: LEFT EXTRACORPOREAL SHOCK WAVE LITHOTRIPSY (ESWL);  Surgeon: Malen Gauze, MD;  Location: [facility];  Service: Urology;  Laterality: Left;  . TONSILLECTOMY     age 22    Home Medications:  Prescriptions Prior to Admission  Medication Sig Dispense Refill Last Dose  . hydrochlorothiazide (HYDRODIURIL) 12.5 MG tablet Take 12.5 mg by mouth daily.   06/24/2017 at 2300  . metFORMIN (GLUCOPHAGE) 500 MG tablet Take by mouth every evening.   06/24/2017 at 1230  . tamsulosin (FLOMAX) 0.4 MG CAPS capsule Take 1 capsule (0.4 mg total) by mouth daily. 7 capsule 0 2300 at Unknown time  . Multiple Vitamin (MULTIVITAMIN WITH MINERALS) TABS tablet Take 1 tablet by mouth daily.   More than a month at Unknown time  . ondansetron (ZOFRAN) 4 MG tablet Take 1 tablet (4 mg total) by mouth every 8 (eight) hours as needed for nausea or vomiting. 12 tablet 0 has not taken  . oxyCODONE (ROXICODONE) 5 MG immediate release tablet  Take 1 tablet (5 mg total) by mouth every 4 (four) hours as needed for severe pain. 20 tablet 0 Past Month at Unknown time  . Vitamin D, Cholecalciferol, 1000 units CAPS Take 4,000 Units by mouth daily.   More than a month at Unknown time   Allergies: No Known Allergies  No family history on file. Social History:  reports that he has never smoked. He has never used smokeless tobacco. He reports that he drinks alcohol. He reports that he does not use drugs.  Review of Systems  Genitourinary: Positive for dysuria, frequency and urgency.  All other systems reviewed and are negative.   Physical Exam:  Vital signs in last 24 hours: Temp:  [98 F (36.7 C)] 98 F (36.7 C) (10/08 1246) Pulse Rate:  [93] 93 (10/08 1246) Resp:  [19] 19 (10/08 1246) BP: (141)/(97) 141/97 (10/08 1246) SpO2:  [96 %] 96 % (10/08 1246) Weight:  [98.9 kg (218 lb)] 98.9 kg (218 lb) (10/08 1246) Physical Exam  Constitutional: He is oriented to person, place, and time. He appears well-developed and well-nourished.  HENT:  Head: Normocephalic and atraumatic.  Eyes: Pupils are equal, round, and reactive to light. EOM are normal.  Neck: Normal range of motion. No thyromegaly present.  Cardiovascular: Normal rate and regular rhythm.   Respiratory: Effort normal. No respiratory distress.  GI: Soft. He exhibits no distension.  Musculoskeletal: Normal range of motion. He exhibits no edema.  Neurological: He is alert and oriented to person, place, and time.  Skin: Skin is warm and dry.  Psychiatric: He has a normal mood and affect. His behavior is normal. Judgment and thought content normal.    Laboratory Data:  No results found for this or any previous visit (from the past 24 hour(s)). No results found for this or any previous visit (from the past 240 hour(s)). Creatinine: No results for input(s): CREATININE in the last 168 hours. Baseline Creatinine: unknwon  Impression/Assessment:  61yo with BPH with incomplete  emptying, bladder calculus  Plan:  The risks/benefits/alternatives to cystolithalopaxy and urolift was explained to the patient and he understands and wishes to proceed with surgery  [name] 06/25/2017, 1:28 PM

## 2017-06-26 ENCOUNTER — Encounter (HOSPITAL_BASED_OUTPATIENT_CLINIC_OR_DEPARTMENT_OTHER): Payer: Self-pay | Admitting: Urology

## 2017-06-26 NOTE — Anesthesia Postprocedure Evaluation (Signed)
Anesthesia Post Note  Patient: Kevin Tanner  Procedure(s) Performed: CYSTOSCOPY WITH LITHOLAPAXY (N/A ) HOLMIUM LASER APPLICATION (N/A ) CYSTOSCOPY (N/A ) CYSTOSCOPY WITH INSERTION OF UROLIFT (N/A )     Anesthesia Post Evaluation  Last Vitals:  Vitals:   06/25/17 1545 06/25/17 1645  BP: 130/85 (!) 142/80  Pulse: 70 70  Resp: 14 14  Temp:  36.6 C  SpO2: 97% 97%    Last Pain:  Vitals:   06/26/17 1356  TempSrc:   PainSc: 2                  Lowella Curb

## 2017-09-18 DIAGNOSIS — Z8709 Personal history of other diseases of the respiratory system: Secondary | ICD-10-CM

## 2017-09-18 HISTORY — DX: Personal history of other diseases of the respiratory system: Z87.09

## 2018-02-07 ENCOUNTER — Emergency Department (HOSPITAL_COMMUNITY)
Admission: EM | Admit: 2018-02-07 | Discharge: 2018-02-07 | Disposition: A | Discharge status: Home / Self Care | Payer: BLUE CROSS/BLUE SHIELD | Attending: Emergency Medicine | Admitting: Emergency Medicine

## 2018-02-07 ENCOUNTER — Other Ambulatory Visit: Payer: Self-pay

## 2018-02-07 ENCOUNTER — Encounter (HOSPITAL_COMMUNITY): Payer: Self-pay | Admitting: Emergency Medicine

## 2018-02-07 DIAGNOSIS — E119 Type 2 diabetes mellitus without complications: Secondary | ICD-10-CM | POA: Insufficient documentation

## 2018-02-07 DIAGNOSIS — Z7984 Long term (current) use of oral hypoglycemic drugs: Secondary | ICD-10-CM | POA: Diagnosis not present

## 2018-02-07 DIAGNOSIS — I10 Essential (primary) hypertension: Secondary | ICD-10-CM | POA: Insufficient documentation

## 2018-02-07 DIAGNOSIS — R1110 Vomiting, unspecified: Secondary | ICD-10-CM | POA: Diagnosis not present

## 2018-02-07 DIAGNOSIS — K529 Noninfective gastroenteritis and colitis, unspecified: Secondary | ICD-10-CM

## 2018-02-07 DIAGNOSIS — Z8679 Personal history of other diseases of the circulatory system: Secondary | ICD-10-CM | POA: Diagnosis not present

## 2018-02-07 DIAGNOSIS — R197 Diarrhea, unspecified: Secondary | ICD-10-CM | POA: Diagnosis present

## 2018-02-07 LAB — COMPREHENSIVE METABOLIC PANEL
ALK PHOS: 51 U/L (ref 38–126)
ALT: 47 U/L (ref 17–63)
AST: 33 U/L (ref 15–41)
Albumin: 4.5 g/dL (ref 3.5–5.0)
Anion gap: 13 (ref 5–15)
BILIRUBIN TOTAL: 1 mg/dL (ref 0.3–1.2)
BUN: 18 mg/dL (ref 6–20)
CALCIUM: 9.6 mg/dL (ref 8.9–10.3)
CO2: 24 mmol/L (ref 22–32)
CREATININE: 1.09 mg/dL (ref 0.61–1.24)
Chloride: 101 mmol/L (ref 101–111)
GFR calc Af Amer: >60 mL/min (ref >60)
Glucose, Bld: 194 mg/dL — ABNORMAL HIGH (ref 65–99)
Potassium: 3.8 mmol/L (ref 3.5–5.1)
Sodium: 138 mmol/L (ref 135–145)
TOTAL PROTEIN: 7.7 g/dL (ref 6.5–8.1)

## 2018-02-07 LAB — URINALYSIS, ROUTINE W REFLEX MICROSCOPIC
BILIRUBIN URINE: NEGATIVE
GLUCOSE, UA: NEGATIVE mg/dL
Hgb urine dipstick: NEGATIVE
KETONES UR: 5 mg/dL — AB
LEUKOCYTES UA: NEGATIVE
NITRITE: NEGATIVE
PH: 5 (ref 5.0–8.0)
Protein, ur: 30 mg/dL — AB
SPECIFIC GRAVITY, URINE: 1.019 (ref 1.005–1.030)

## 2018-02-07 LAB — CBC
HCT: 50.4 % (ref 39.0–52.0)
Hemoglobin: 17.6 g/dL — ABNORMAL HIGH (ref 13.0–17.0)
MCH: 30.7 pg (ref 26.0–34.0)
MCHC: 34.9 g/dL (ref 30.0–36.0)
MCV: 87.8 fL (ref 78.0–100.0)
PLATELETS: 189 10*3/uL (ref 150–400)
RBC: 5.74 MIL/uL (ref 4.22–5.81)
RDW: 13.2 % (ref 11.5–15.5)
WBC: 11.9 10*3/uL — AB (ref 4.0–10.5)

## 2018-02-07 LAB — LIPASE, BLOOD: Lipase: 43 U/L (ref 11–51)

## 2018-02-07 MED ORDER — ONDANSETRON 4 MG PO TBDP
ORAL_TABLET | ORAL | Status: AC
  Filled 2018-02-07: qty 1

## 2018-02-07 MED ORDER — ONDANSETRON HCL 4 MG/2ML IJ SOLN
4.0000 mg | Freq: Once | INTRAMUSCULAR | Status: AC
  Administered 2018-02-07: 4 mg via INTRAVENOUS
  Filled 2018-02-07: qty 2

## 2018-02-07 MED ORDER — SODIUM CHLORIDE 0.9 % IV BOLUS
1000.0000 mL | Freq: Once | INTRAVENOUS | Status: AC
  Administered 2018-02-07: 1000 mL via INTRAVENOUS

## 2018-02-07 MED ORDER — ONDANSETRON 8 MG PO TBDP: 8.0000 mg | ORAL_TABLET | Freq: Three times a day (TID) | ORAL | 0 refills | Status: DC | PRN

## 2018-02-07 MED ORDER — ONDANSETRON 4 MG PO TBDP
4.0000 mg | ORAL_TABLET | Freq: Once | ORAL | Status: AC | PRN
  Administered 2018-02-07: 4 mg via ORAL

## 2018-02-07 NOTE — ED Triage Notes (Signed)
Pt states that he started feeling nauseated and faint around 1030 this evening.  Has vomited 4 times.  Pt has red rash on face from taking a topical gel for fluorouracil for precancerous cells.  Was told that it would break him out.  C/o abd pain, NV, and diarrhea.  Takes HCTZ for hypertension.

## 2018-02-07 NOTE — ED Provider Notes (Signed)
[facility] Provider Note: [name], MD, FACEP  CSN: [id number] MRN: [id number] ARRIVAL: 02/07/18 at 0132 ROOM: WA21/[facility]21   CHIEF COMPLAINT  Vomiting   HISTORY OF PRESENT ILLNESS  02/07/18 5:29 AM Kevin Tanner is a 62 y.o. male who had 2 episodes of diarrhea yesterday during the day.  Yesterday evening about 10:30 PM he developed Oestreich pain which she describes as the sensation of someone having punched him in the stomach.  This is associated with lightheadedness, diaphoresis and chills.  He rated his pain as a 4 out of 10 at its worst.  He had one episode of vomiting.  He continued to feel nauseated and stuck his finger down his throat inducing 3 more episodes of vomiting.  He was given Zofran ODT on arrival here and is feeling better.  His pain is minimal at the present time.  He has a facial rash due to topical fluorouracil for precancerous lesions.   Past Medical History:  Diagnosis Date  . Anxiety   . Diabetes mellitus without complication (HCC)    type 2   . Diverticulosis   . Esophageal stricture   . GERD (gastroesophageal reflux disease)   . Hay fever   . Heart murmur    grade 1  . History of bronchitis   . History of hiatal hernia   . History of kidney stones   . Hypertension    not on med yet  . Pneumonia    bacterial in early 20's     Past Surgical History:  Procedure Laterality Date  . COLONOSCOPY    . CYSTOSCOPY  06/14/2017   in office  . CYSTOSCOPY N/A 06/25/2017   Procedure: CYSTOSCOPY;  Surgeon: Malen Gauze, MD;  Location: [facility];  Service: Urology;  Laterality: N/A;  . CYSTOSCOPY WITH INSERTION OF UROLIFT N/A 06/25/2017   Procedure: CYSTOSCOPY WITH INSERTION OF UROLIFT;  Surgeon: Malen Gauze, MD;  Location: [facility];  Service: Urology;  Laterality: N/A;  . CYSTOSCOPY WITH LITHOLAPAXY N/A 06/25/2017   Procedure: CYSTOSCOPY WITH LITHOLAPAXY;  Surgeon: Malen Gauze, MD;  Location:  [facility];  Service: Urology;  Laterality: N/A;  . EXTRACORPOREAL SHOCK WAVE LITHOTRIPSY Left 05/17/2017   Procedure: LEFT EXTRACORPOREAL SHOCK WAVE LITHOTRIPSY (ESWL);  Surgeon: Malen Gauze, MD;  Location: [facility];  Service: Urology;  Laterality: Left;  . EXTRACORPOREAL SHOCK WAVE LITHOTRIPSY Left 05/24/2017   Procedure: LEFT EXTRACORPOREAL SHOCK WAVE LITHOTRIPSY (ESWL);  Surgeon: Malen Gauze, MD;  Location: [facility];  Service: Urology;  Laterality: Left;  . HOLMIUM LASER APPLICATION N/A 06/25/2017   Procedure: HOLMIUM LASER APPLICATION;  Surgeon: Malen Gauze, MD;  Location: [facility];  Service: Urology;  Laterality: N/A;  . TONSILLECTOMY     age 36    No family history on file.  Social History   Tobacco Use  . Smoking status: Never Smoker  . Smokeless tobacco: Never Used  Substance Use Topics  . Alcohol use: Yes    Comment: glass of wine or beer 1 time per month   . Drug use: No    Prior to Admission medications   Medication Sig Start Date End Date Taking? Authorizing Provider  fluorouracil (EFUDEX) 5 % cream Apply 1 application topically 2 (two) times daily. 12/17/17  Yes [provider]  hydrochlorothiazide (HYDRODIURIL) 12.5 MG tablet Take 12.5 mg by mouth daily.   Yes [provider]  loratadine (CLARITIN) 10 MG tablet  Take 10 mg by mouth daily.   Yes [provider]  metFORMIN (GLUCOPHAGE) 500 MG tablet Take 500 mg by mouth daily.    Yes [provider]  oxyCODONE (ROXICODONE) 5 MG immediate release tablet Take 1 tablet (5 mg total) by mouth every 4 (four) hours as needed for severe pain. Patient not taking: Reported on 02/07/2018 06/25/17   Malen Gauze, MD    Allergies Patient has no known allergies.   REVIEW OF SYSTEMS  Negative except as noted here or in the History of Present Illness.   PHYSICAL EXAMINATION  Initial Vital Signs Blood pressure (!) 185/101, pulse 66,  temperature (!) 97.5 F (36.4 C), temperature source Oral, resp. rate 20, SpO2 100 %.  Examination General: Well-developed, well-nourished male in no acute distress; appearance consistent with age of record HENT: normocephalic; atraumatic Eyes: pupils equal, round and reactive to light; extraocular muscles intact Neck: supple Heart: regular rate and rhythm Lungs: clear to auscultation bilaterally Abdomen: soft; nondistended; mild epigastric tenderness; no masses or hepatosplenomegaly; bowel sounds present Extremities: No deformity; full range of motion; pulses normal Neurologic: Awake, alert and oriented; motor function intact in all extremities and symmetric; no facial droop Skin: Warm and dry; erythematous macular rash of upper face Psychiatric: Normal mood and affect   RESULTS  Summary of this visit's results, reviewed by myself:   EKG Interpretation  Date/Time:    Ventricular Rate:    PR Interval:    QRS Duration:   QT Interval:    QTC Calculation:   R Axis:     Text Interpretation:        Laboratory Studies: Results for orders placed or performed during the hospital encounter of 02/07/18 (from the past 24 hour(s))  Lipase, blood     Status: None   Collection Time: 02/07/18  1:52 AM  Result Value Ref Range   Lipase 43 11 - 51 U/L  Comprehensive metabolic panel     Status: Abnormal   Collection Time: 02/07/18  1:52 AM  Result Value Ref Range   Sodium 138 135 - 145 mmol/L   Potassium 3.8 3.5 - 5.1 mmol/L   Chloride 101 101 - 111 mmol/L   CO2 24 22 - 32 mmol/L   Glucose, Bld 194 (H) 65 - 99 mg/dL   BUN 18 6 - 20 mg/dL   Creatinine, Ser 6.21 0.61 - 1.24 mg/dL   Calcium 9.6 8.9 - 30.8 mg/dL   Total Protein 7.7 6.5 - 8.1 g/dL   Albumin 4.5 3.5 - 5.0 g/dL   AST 33 15 - 41 U/L   ALT 47 17 - 63 U/L   Alkaline Phosphatase 51 38 - 126 U/L   Total Bilirubin 1.0 0.3 - 1.2 mg/dL   GFR calc non Af Amer >60 >60 mL/min   GFR calc Af Amer >60 >60 mL/min   Anion gap 13 5 -  15  CBC     Status: Abnormal   Collection Time: 02/07/18  1:52 AM  Result Value Ref Range   WBC 11.9 (H) 4.0 - 10.5 K/uL   RBC 5.74 4.22 - 5.81 MIL/uL   Hemoglobin 17.6 (H) 13.0 - 17.0 g/dL   HCT 65.7 84.6 - 96.2 %   MCV 87.8 78.0 - 100.0 fL   MCH 30.7 26.0 - 34.0 pg   MCHC 34.9 30.0 - 36.0 g/dL   RDW 95.2 84.1 - 32.4 %   Platelets 189 150 - 400 K/uL  Urinalysis, Routine w reflex microscopic  Status: Abnormal   Collection Time: 02/07/18  5:50 AM  Result Value Ref Range   Color, Urine YELLOW YELLOW   APPearance CLEAR CLEAR   Specific Gravity, Urine 1.019 1.005 - 1.030   pH 5.0 5.0 - 8.0   Glucose, UA NEGATIVE NEGATIVE mg/dL   Hgb urine dipstick NEGATIVE NEGATIVE   Bilirubin Urine NEGATIVE NEGATIVE   Ketones, ur 5 (A) NEGATIVE mg/dL   Protein, ur 30 (A) NEGATIVE mg/dL   Nitrite NEGATIVE NEGATIVE   Leukocytes, UA NEGATIVE NEGATIVE   RBC / HPF 0-5 0 - 5 RBC/hpf   WBC, UA 0-5 0 - 5 WBC/hpf   Bacteria, UA RARE (A) NONE SEEN   Mucus PRESENT    Imaging Studies: No results found.  ED COURSE and MDM  Nursing notes and initial vitals signs, including pulse oximetry, reviewed.  Vitals:   02/07/18 0145 02/07/18 0605  BP: (!) 185/101 133/77  Pulse: 66 65  Resp: 20 18  Temp: (!) 97.5 F (36.4 C)   TempSrc: Oral   SpO2: 100% 98%   6:47 AM Patient feeling better after IV fluids and additional Zofran.  He is drinking fluids without emesis.  This likely represents a gastrointestinal viral illness.  PROCEDURES    ED DIAGNOSES     ICD-10-CM   1. Gastroenteritis K52.9        Malayshia All, MD 02/07/18 [phone]

## 2018-09-12 ENCOUNTER — Other Ambulatory Visit: Payer: Self-pay | Admitting: Family Medicine

## 2018-09-12 DIAGNOSIS — R7989 Other specified abnormal findings of blood chemistry: Secondary | ICD-10-CM

## 2018-09-12 DIAGNOSIS — R945 Abnormal results of liver function studies: Principal | ICD-10-CM

## 2018-09-27 ENCOUNTER — Ambulatory Visit
Admission: RE | Admit: 2018-09-27 | Discharge: 2018-09-27 | Disposition: A | Discharge status: Home / Self Care | Payer: BLUE CROSS/BLUE SHIELD | Source: Ambulatory Visit | Attending: Family Medicine | Admitting: Family Medicine

## 2018-09-27 DIAGNOSIS — R945 Abnormal results of liver function studies: Principal | ICD-10-CM

## 2018-09-27 DIAGNOSIS — R7989 Other specified abnormal findings of blood chemistry: Secondary | ICD-10-CM

## 2019-02-14 ENCOUNTER — Other Ambulatory Visit: Payer: Self-pay | Admitting: Urology

## 2019-02-20 ENCOUNTER — Other Ambulatory Visit: Payer: Self-pay | Admitting: Family Medicine

## 2019-02-20 ENCOUNTER — Other Ambulatory Visit: Payer: Self-pay

## 2019-02-20 ENCOUNTER — Ambulatory Visit
Admission: RE | Admit: 2019-02-20 | Discharge: 2019-02-20 | Disposition: A | Discharge status: Home / Self Care | Payer: BLUE CROSS/BLUE SHIELD | Source: Ambulatory Visit | Attending: Family Medicine | Admitting: Family Medicine

## 2019-02-20 DIAGNOSIS — M79672 Pain in left foot: Secondary | ICD-10-CM

## 2019-02-26 ENCOUNTER — Other Ambulatory Visit: Payer: Self-pay

## 2019-02-26 ENCOUNTER — Encounter (HOSPITAL_BASED_OUTPATIENT_CLINIC_OR_DEPARTMENT_OTHER): Payer: Self-pay

## 2019-02-26 NOTE — Progress Notes (Signed)
SPOKE W/  [facility]     SCREENING SYMPTOMS OF COVID 19:   COUGH--NO  RUNNY NOSE--- NO  SORE THROAT---NO  NASAL CONGESTION----NO  SNEEZING----NO  SHORTNESS OF BREATH---NO  DIFFICULTY BREATHING---NO  TEMP >100.0 -----NO  UNEXPLAINED BODY ACHES------NO  CHILLS -------- NO  HEADACHES ---------NO  LOSS OF SMELL/ TASTE --------NO    HAVE YOU OR ANY FAMILY MEMBER TRAVELLED PAST 14 DAYS OUT OF THE   COUNTY---NO STATE----NO COUNTRY----NO  HAVE YOU OR ANY FAMILY MEMBER BEEN EXPOSED TO ANYONE WITH COVID 19? NO

## 2019-02-26 NOTE — Progress Notes (Signed)
Spoke with: Glendell Docker NPO:  Liquids until 9:00AM day of surgery Arrival time:  1300 Labs: Istat4, EKG (COVID 6/11 in epic) AM medications: None Pre op orders: Yes Ride home:  [facility]) [phone]

## 2019-02-27 ENCOUNTER — Other Ambulatory Visit (HOSPITAL_COMMUNITY)
Admission: RE | Admit: 2019-02-27 | Discharge: 2019-02-27 | Disposition: A | Discharge status: Home / Self Care | Payer: BC Managed Care – PPO | Source: Ambulatory Visit | Attending: Urology | Admitting: Urology

## 2019-02-27 DIAGNOSIS — Z1159 Encounter for screening for other viral diseases: Secondary | ICD-10-CM | POA: Insufficient documentation

## 2019-02-28 LAB — NOVEL CORONAVIRUS, NAA (HOSP ORDER, SEND-OUT TO REF LAB; TAT 18-24 HRS): SARS-CoV-2, NAA: NOT DETECTED

## 2019-02-28 NOTE — Progress Notes (Signed)
SPOKE W/  pt   SCREENING SYMPTOMS OF COVID 19:   COUGH--no  RUNNY NOSE--- no  SORE THROAT---no  NASAL CONGESTION----no  SNEEZING----no  SHORTNESS OF BREATH--no-  DIFFICULTY BREATHING---no  TEMP >100.0 ----no-  UNEXPLAINED BODY ACHES----no--  CHILLS --no------   HEADACHES ----no-----  LOSS OF SMELL/ TASTE ---no-----    HAVE YOU OR ANY FAMILY MEMBER TRAVELLED PAST 14 DAYS OUT OF THE   COUNTY--no- STATE----no COUNTRY---no-  HAVE YOU OR ANY FAMILY MEMBER BEEN EXPOSED TO ANYONE WITH COVID 19? no    

## 2019-02-28 NOTE — Progress Notes (Signed)
Called patient and left message to return call to complete pre-op COVID-19 screening questions.  

## 2019-03-03 ENCOUNTER — Ambulatory Visit (HOSPITAL_BASED_OUTPATIENT_CLINIC_OR_DEPARTMENT_OTHER): Payer: BC Managed Care – PPO | Admitting: Anesthesiology

## 2019-03-03 ENCOUNTER — Encounter (HOSPITAL_BASED_OUTPATIENT_CLINIC_OR_DEPARTMENT_OTHER): Payer: Self-pay | Admitting: *Deleted

## 2019-03-03 ENCOUNTER — Encounter (HOSPITAL_BASED_OUTPATIENT_CLINIC_OR_DEPARTMENT_OTHER)
Admission: RE | Disposition: A | Discharge status: Home / Self Care | Payer: Self-pay | Source: Home / Self Care | Attending: Urology

## 2019-03-03 ENCOUNTER — Ambulatory Visit (HOSPITAL_BASED_OUTPATIENT_CLINIC_OR_DEPARTMENT_OTHER)
Admission: RE | Admit: 2019-03-03 | Discharge: 2019-03-03 | Disposition: A | Discharge status: Home / Self Care | Payer: BC Managed Care – PPO | Attending: Urology | Admitting: Urology

## 2019-03-03 ENCOUNTER — Other Ambulatory Visit: Payer: Self-pay

## 2019-03-03 DIAGNOSIS — L720 Epidermal cyst: Secondary | ICD-10-CM | POA: Insufficient documentation

## 2019-03-03 DIAGNOSIS — E119 Type 2 diabetes mellitus without complications: Secondary | ICD-10-CM | POA: Diagnosis not present

## 2019-03-03 DIAGNOSIS — I10 Essential (primary) hypertension: Secondary | ICD-10-CM | POA: Diagnosis not present

## 2019-03-03 DIAGNOSIS — L723 Sebaceous cyst: Secondary | ICD-10-CM | POA: Diagnosis present

## 2019-03-03 HISTORY — DX: Presence of spectacles and contact lenses: Z97.3

## 2019-03-03 HISTORY — DX: Fatty (change of) liver, not elsewhere classified: K76.0

## 2019-03-03 HISTORY — DX: Gout, unspecified: M10.9

## 2019-03-03 HISTORY — PX: CYST EXCISION: SHX5701

## 2019-03-03 LAB — POCT I-STAT 4, (NA,K, GLUC, HGB,HCT)
Glucose, Bld: 124 mg/dL — ABNORMAL HIGH (ref 70–99)
HCT: 51 % (ref 39.0–52.0)
Hemoglobin: 17.3 g/dL — ABNORMAL HIGH (ref 13.0–17.0)
Potassium: 3.9 mmol/L (ref 3.5–5.1)
Sodium: 141 mmol/L (ref 135–145)

## 2019-03-03 LAB — GLUCOSE, CAPILLARY: Glucose-Capillary: 99 mg/dL (ref 70–99)

## 2019-03-03 SURGERY — CYST REMOVAL
Anesthesia: General | Site: Penis

## 2019-03-03 MED ORDER — HYDROCODONE-ACETAMINOPHEN 5-325 MG PO TABS: 1.0000 | ORAL_TABLET | ORAL | 0 refills | Status: AC | PRN

## 2019-03-03 MED ORDER — DEXAMETHASONE SODIUM PHOSPHATE 10 MG/ML IJ SOLN
INTRAMUSCULAR | Status: AC
  Filled 2019-03-03: qty 1

## 2019-03-03 MED ORDER — LIDOCAINE HCL (CARDIAC) PF 100 MG/5ML IV SOSY
PREFILLED_SYRINGE | INTRAVENOUS | Status: DC | PRN
  Administered 2019-03-03: 60 mg via INTRAVENOUS

## 2019-03-03 MED ORDER — FENTANYL CITRATE (PF) 100 MCG/2ML IJ SOLN
INTRAMUSCULAR | Status: DC | PRN
  Administered 2019-03-03 (×4): 25 ug via INTRAVENOUS

## 2019-03-03 MED ORDER — ONDANSETRON HCL 4 MG/2ML IJ SOLN
INTRAMUSCULAR | Status: AC
  Filled 2019-03-03: qty 2

## 2019-03-03 MED ORDER — EPHEDRINE 5 MG/ML INJ
INTRAVENOUS | Status: AC
  Filled 2019-03-03: qty 10

## 2019-03-03 MED ORDER — SODIUM CHLORIDE 0.9 % IR SOLN
Status: DC | PRN
  Administered 2019-03-03: 500 mL

## 2019-03-03 MED ORDER — OXYCODONE HCL 5 MG PO TABS
5.0000 mg | ORAL_TABLET | Freq: Once | ORAL | Status: DC | PRN
  Filled 2019-03-03: qty 1

## 2019-03-03 MED ORDER — FENTANYL CITRATE (PF) 100 MCG/2ML IJ SOLN
INTRAMUSCULAR | Status: AC
  Filled 2019-03-03: qty 2

## 2019-03-03 MED ORDER — OXYCODONE HCL 5 MG/5ML PO SOLN
5.0000 mg | Freq: Once | ORAL | Status: DC | PRN
  Filled 2019-03-03: qty 5

## 2019-03-03 MED ORDER — PROPOFOL 10 MG/ML IV BOLUS
INTRAVENOUS | Status: AC
  Filled 2019-03-03: qty 20

## 2019-03-03 MED ORDER — ONDANSETRON HCL 4 MG/2ML IJ SOLN
INTRAMUSCULAR | Status: DC | PRN
  Administered 2019-03-03: 4 mg via INTRAVENOUS

## 2019-03-03 MED ORDER — CEFAZOLIN SODIUM-DEXTROSE 2-4 GM/100ML-% IV SOLN
INTRAVENOUS | Status: AC
  Filled 2019-03-03: qty 100

## 2019-03-03 MED ORDER — MIDAZOLAM HCL 5 MG/5ML IJ SOLN
INTRAMUSCULAR | Status: DC | PRN
  Administered 2019-03-03: 2 mg via INTRAVENOUS

## 2019-03-03 MED ORDER — EPHEDRINE SULFATE 50 MG/ML IJ SOLN
INTRAMUSCULAR | Status: DC | PRN
  Administered 2019-03-03: 20 mg via INTRAVENOUS

## 2019-03-03 MED ORDER — FENTANYL CITRATE (PF) 100 MCG/2ML IJ SOLN
25.0000 ug | INTRAMUSCULAR | Status: DC | PRN
  Filled 2019-03-03: qty 1

## 2019-03-03 MED ORDER — LACTATED RINGERS IV SOLN
INTRAVENOUS | Status: DC
  Administered 2019-03-03 (×2): via INTRAVENOUS
  Filled 2019-03-03: qty 1000

## 2019-03-03 MED ORDER — DEXAMETHASONE SODIUM PHOSPHATE 4 MG/ML IJ SOLN
INTRAMUSCULAR | Status: DC | PRN
  Administered 2019-03-03: 5 mg via INTRAVENOUS

## 2019-03-03 MED ORDER — ONDANSETRON HCL 4 MG/2ML IJ SOLN
4.0000 mg | Freq: Four times a day (QID) | INTRAMUSCULAR | Status: DC | PRN
  Filled 2019-03-03: qty 2

## 2019-03-03 MED ORDER — CEFAZOLIN SODIUM-DEXTROSE 2-4 GM/100ML-% IV SOLN
2.0000 g | INTRAVENOUS | Status: AC
  Administered 2019-03-03: 2 g via INTRAVENOUS
  Filled 2019-03-03: qty 100

## 2019-03-03 MED ORDER — MIDAZOLAM HCL 2 MG/2ML IJ SOLN
INTRAMUSCULAR | Status: AC
  Filled 2019-03-03: qty 2

## 2019-03-03 MED ORDER — PROPOFOL 10 MG/ML IV BOLUS
INTRAVENOUS | Status: DC | PRN
  Administered 2019-03-03: 200 mg via INTRAVENOUS

## 2019-03-03 MED ORDER — KETOROLAC TROMETHAMINE 30 MG/ML IJ SOLN
INTRAMUSCULAR | Status: DC | PRN
  Administered 2019-03-03: 30 mg via INTRAVENOUS

## 2019-03-03 MED ORDER — BUPIVACAINE HCL (PF) 0.25 % IJ SOLN
INTRAMUSCULAR | Status: DC | PRN
  Administered 2019-03-03: 10 mL

## 2019-03-03 MED ORDER — LIDOCAINE 2% (20 MG/ML) 5 ML SYRINGE
INTRAMUSCULAR | Status: AC
  Filled 2019-03-03: qty 5

## 2019-03-03 SURGICAL SUPPLY — 52 items
APPLICATOR COTTON TIP 6IN STRL (MISCELLANEOUS) IMPLANT
BAG URINE DRAINAGE (UROLOGICAL SUPPLIES) IMPLANT
BLADE CLIPPER SENSICLIP SURGIC (BLADE) IMPLANT
BLADE SURG 15 STRL LF DISP TIS (BLADE) ×1 IMPLANT
BLADE SURG 15 STRL SS (BLADE) ×2
BNDG CONFORM 2 STRL LF (GAUZE/BANDAGES/DRESSINGS) ×3 IMPLANT
CATH FOLEY 2WAY SLVR  5CC 16FR (CATHETERS)
CATH FOLEY 2WAY SLVR 5CC 16FR (CATHETERS) IMPLANT
COVER BACK TABLE 60X90IN (DRAPES) ×3 IMPLANT
COVER MAYO STAND STRL (DRAPES) ×3 IMPLANT
DERMABOND ADVANCED (GAUZE/BANDAGES/DRESSINGS)
DERMABOND ADVANCED .7 DNX12 (GAUZE/BANDAGES/DRESSINGS) IMPLANT
DISSECTOR ROUND CHERRY 3/8 STR (MISCELLANEOUS) IMPLANT
DRAIN PENROSE 18X1/2 LTX STRL (DRAIN) ×3 IMPLANT
DRAPE LAPAROTOMY 100X72 PEDS (DRAPES) ×3 IMPLANT
DRSG TELFA 3X8 NADH (GAUZE/BANDAGES/DRESSINGS) ×3 IMPLANT
ELECT NEEDLE TIP 2.8 STRL (NEEDLE) ×3 IMPLANT
ELECT REM PT RETURN 9FT ADLT (ELECTROSURGICAL) ×3
ELECTRODE REM PT RTRN 9FT ADLT (ELECTROSURGICAL) ×1 IMPLANT
GAUZE SPONGE 4X4 12PLY STRL LF (GAUZE/BANDAGES/DRESSINGS) ×3 IMPLANT
GLOVE BIO SURGEON STRL SZ8 (GLOVE) ×3 IMPLANT
GLOVE BIOGEL M 6.5 STRL (GLOVE) ×3 IMPLANT
GLOVE BIOGEL PI IND STRL 7.0 (GLOVE) ×2 IMPLANT
GLOVE BIOGEL PI INDICATOR 7.0 (GLOVE) ×4
GOWN STRL REUS W/ TWL LRG LVL3 (GOWN DISPOSABLE) ×1 IMPLANT
GOWN STRL REUS W/ TWL XL LVL3 (GOWN DISPOSABLE) ×1 IMPLANT
GOWN STRL REUS W/TWL LRG LVL3 (GOWN DISPOSABLE) ×2
GOWN STRL REUS W/TWL XL LVL3 (GOWN DISPOSABLE) ×2
KIT TURNOVER CYSTO (KITS) ×3 IMPLANT
NEEDLE HYPO 25X1 1.5 SAFETY (NEEDLE) ×3 IMPLANT
NS IRRIG 500ML POUR BTL (IV SOLUTION) IMPLANT
PACK BASIN DAY SURGERY FS (CUSTOM PROCEDURE TRAY) ×3 IMPLANT
PENCIL BUTTON HOLSTER BLD 10FT (ELECTRODE) ×3 IMPLANT
SUPPORT SCROTAL LG STRP (MISCELLANEOUS) ×2 IMPLANT
SUPPORTER ATHLETIC LG (MISCELLANEOUS) ×1
SUT CHROMIC 3 0 SH 27 (SUTURE) IMPLANT
SUT CHROMIC 4 0 PS 2 18 (SUTURE) IMPLANT
SUT CHROMIC 4 0 SH 27 (SUTURE) IMPLANT
SUT CHROMIC GUT AB #0 18 (SUTURE) IMPLANT
SUT MNCRL AB 4-0 PS2 18 (SUTURE) IMPLANT
SUT PROLENE 4 0 RB 1 (SUTURE)
SUT PROLENE 4-0 RB1 .5 CRCL 36 (SUTURE) IMPLANT
SUT SILK 0 SH 30 (SUTURE) IMPLANT
SUT SILK 0 TIES 10X30 (SUTURE) IMPLANT
SUT VIC AB 0 SH 27 (SUTURE) IMPLANT
SUT VIC AB 2-0 SH 27 (SUTURE)
SUT VIC AB 2-0 SH 27XBRD (SUTURE) IMPLANT
SUT VICRYL 2 0 18  UND BR (SUTURE)
SUT VICRYL 2 0 18 UND BR (SUTURE) IMPLANT
SYR CONTROL 10ML LL (SYRINGE) ×3 IMPLANT
TOWEL OR 17X26 10 PK STRL BLUE (TOWEL DISPOSABLE) ×6 IMPLANT
TRAY DSU PREP LF (CUSTOM PROCEDURE TRAY) ×3 IMPLANT

## 2019-03-03 NOTE — Anesthesia Preprocedure Evaluation (Signed)
Anesthesia Evaluation  Patient identified by MRN, date of birth, ID band Patient awake    Reviewed: Allergy & Precautions, H&P , NPO status , Patient's Chart, lab work & pertinent test results  Airway Mallampati: II   Neck ROM: full    Dental   Pulmonary neg pulmonary ROS,    breath sounds clear to auscultation       Cardiovascular hypertension,  Rhythm:regular Rate:Normal     Neuro/Psych PSYCHIATRIC DISORDERS Anxiety    GI/Hepatic hiatal hernia, GERD  ,  Endo/Other  diabetes, Type 2  Renal/GU      Musculoskeletal   Abdominal   Peds  Hematology   Anesthesia Other Findings   Reproductive/Obstetrics                             Anesthesia Physical Anesthesia Plan  ASA: II  Anesthesia Plan: General   Post-op Pain Management:    Induction: Intravenous  PONV Risk Score and Plan: 2 and Ondansetron, Dexamethasone, Midazolam and Treatment may vary due to age or medical condition  Airway Management Planned: LMA  Additional Equipment:   Intra-op Plan:   Post-operative Plan:   Informed Consent: I have reviewed the patients History and Physical, chart, labs and discussed the procedure including the risks, benefits and alternatives for the proposed anesthesia with the patient or authorized representative who has indicated his/her understanding and acceptance.       Plan Discussed with: CRNA, Anesthesiologist and Surgeon  Anesthesia Plan Comments:         Anesthesia Quick Evaluation

## 2019-03-03 NOTE — Op Note (Signed)
Preoperative diagnosis: penile sebaceous cyst  Postoperative diagnosis: Same  Procedure: Excision of penile sebaceous cyst  Attending: Nicolette Bang, MD  Anesthesia: general  History of blood loss: Minimal  Antibiotics: ancef  Drains: none  Specimens: penile shaft sebaceous cyst  Findings: 1cm sebaceous cyst on left distal penile shaft  Indications: Patient is a 63 year old male with a history of a penile sebaceous cyst which recently became infected and was draining purulent material.  After discussing treatment options he decided to proceed with excision of his penile sebaceous cyst.  Procedure in detail: Prior to procedure consent was obtained.  Patient was brought to the operating room and a brief timeout was done to ensure correct patient, correct procedure, correct site.  General anesthesia was administered and patient was placed in supine position.  His genitalia and abdomen was then prepped and draped in usual sterile fashion. We made an elliptical incision around the sebaceous cyst. Using sharp dissection the cyst was excised and the defect was closed with a running 4-0 vicryl suture. Once this was complete we applied a gauze bandage and this then concluded the procedure which was well tolerated by the patient.  Complications: None  Condition: Stable, extubated, transferred to PACU.  Plan: Patient is to be discharged home.  He is to followup in 2 weeks for a wound check

## 2019-03-03 NOTE — Transfer of Care (Signed)
Immediate Anesthesia Transfer of Care Note  Patient: Kevin Tanner  Procedure(s) Performed: Procedure(s) (LRB): CYST REMOVAL (N/A)  Patient Location: PACU  Anesthesia Type: General  Level of Consciousness: awake, sedated, patient cooperative and responds to stimulation  Airway & Oxygen Therapy: Patient Spontanous Breathing and Patient connected to [place] O2 and soft face mask   Post-op Assessment: Report given to PACU RN, Post -op Vital signs reviewed and stable and Patient moving all extremities  Post vital signs: Reviewed and stable  Complications: No apparent anesthesia complications

## 2019-03-03 NOTE — Anesthesia Procedure Notes (Signed)
Procedure Name: LMA Insertion Date/Time: 03/03/2019 4:19 PM Performed by: Justice Rocher, CRNA Pre-anesthesia Checklist: Patient identified, Emergency Drugs available, Suction available and Patient being monitored Patient Re-evaluated:Patient Re-evaluated prior to induction Oxygen Delivery Method: Circle system utilized Preoxygenation: Pre-oxygenation with 100% oxygen Induction Type: IV induction Ventilation: Mask ventilation without difficulty LMA: LMA inserted LMA Size: 5.0 Number of attempts: 1 Airway Equipment and Method: Bite block Placement Confirmation: positive ETCO2 and breath sounds checked- equal and bilateral Tube secured with: Tape Dental Injury: Teeth and Oropharynx as per pre-operative assessment

## 2019-03-03 NOTE — Discharge Instructions (Signed)
Epidermal Cyst Removal, Care After This sheet gives you information about how to care for yourself after your procedure. Your health care provider may also give you more specific instructions. If you have problems or questions, contact your health care provider. What can I expect after the procedure? After the procedure, it is common to have:  Soreness in the area where your cyst was removed.  Tightness or itchiness from the stitches (sutures) in your skin. Follow these instructions at home: Medicines  Take over-the-counter and prescription medicines only as told by your health care provider.  If you were prescribed an antibiotic medicine or ointment, take or apply it as told by your health care provider. Do not stop using the antibiotic even if you start to feel better. Incision care   Follow instructions from your health care provider about how to take care of your incision. Make sure you: ? Wash your hands with soap and water before you change your bandage (dressing). If soap and water are not available, use hand sanitizer. ? Change your dressing as told by your health care provider. ? Leave sutures, skin glue, or adhesive strips in place. These skin closures may need to stay in place for 1-2 weeks or longer. If adhesive strip edges start to loosen and curl up, you may trim the loose edges. Do not remove adhesive strips completely unless your health care provider tells you to do that.  Keep the dressingdry until your health care provider says that it can be removed.  After your dressing is off, check your incision area every day for signs of infection. Check for: ? Redness, swelling, or pain. ? Fluid or blood. ? Warmth. ? Pus or a bad smell. General instructions  Do not take baths, swim, or use a hot tub until your health care provider approves. Ask your health care provider if you may take showers. You may only be allowed to take sponge baths.  Your health care provider may ask  you to avoid contact sports or activities that take a lot of effort. Do not do anything that stretches or puts pressure on your incision.  You can return to your normal diet.  Keep all follow-up visits as told by your health care provider. This is important. Contact a health care provider if:  You have a fever.  You have redness, swelling, or pain in the incision area.  You have fluid or blood coming from your incision.  You have pus or a bad smell coming from your incision.  Your incision feels warm to the touch.  Your cyst grows back. Summary  After the procedure, it is common to have soreness in the area where your cyst was removed.  Take or apply over-the-counter and prescription medicines only as told by your health care provider.  Follow instructions from your health care provider about how to take care of your incision. This information is not intended to replace advice given to you by your health care provider. Make sure you discuss any questions you have with your health care provider. Document Released: 09/25/2014 Document Revised: 12/25/2017 Document Reviewed: 06/28/2017 Elsevier Interactive Patient Education  2019 [facility] Care Instructions  Activity: Get plenty of rest for the remainder of the day. A responsible individual must stay with you for 24 hours following the procedure.  For the next 24 hours, DO NOT: -Drive a car -Paediatric nurse -Drink alcoholic beverages -Take any medication unless instructed by your  physician -Make any legal decisions or sign important papers.  Meals: Start with liquid foods such as gelatin or soup. Progress to regular foods as tolerated. Avoid greasy, spicy, heavy foods. If nausea and/or vomiting occur, drink only clear liquids until the nausea and/or vomiting subsides. Call your physician if vomiting continues.  Special Instructions/Symptoms: Your throat may feel dry or sore from the  anesthesia or the breathing tube placed in your throat during surgery. If this causes discomfort, gargle with warm salt water. The discomfort should disappear within 24 hours.

## 2019-03-03 NOTE — Anesthesia Postprocedure Evaluation (Signed)
Anesthesia Post Note  Patient: Kevin Tanner  Procedure(s) Performed: CYST REMOVAL (N/A Penis)     Patient location during evaluation: PACU Anesthesia Type: General Level of consciousness: awake and alert Pain management: pain level controlled Vital Signs Assessment: post-procedure vital signs reviewed and stable Respiratory status: spontaneous breathing, nonlabored ventilation and respiratory function stable Cardiovascular status: blood pressure returned to baseline and stable Postop Assessment: no apparent nausea or vomiting Anesthetic complications: no    Last Vitals:  Vitals:   03/03/19 1730 03/03/19 1734  BP: 125/73   Pulse: 93 89  Resp: 16 19  Temp:    SpO2: 98% 95%    Last Pain:  Vitals:   03/03/19 1734  TempSrc:   PainSc: 0-No pain                 Audry Pili

## 2019-03-03 NOTE — H&P (Signed)
Urology Admission H&P  Chief Complaint: penile cyst  History of Present Illness: [name] is a 63yo with a hx of penile sebaceous cyst which recently was infected. He has since finished antibiotics and the cyt has decreased in size to 1cm. He did have bloody drainage from the cyst 1 month ago. No pain currently. No recent fevers/chills/sweats.  Past Medical History:  Diagnosis Date  . Anxiety   . Diabetes mellitus without complication (HCC)    type 2   . Diverticulosis   . Esophageal stricture   . Fatty liver   . GERD (gastroesophageal reflux disease)   . Gout   . Hay fever   . Heart murmur    grade 1  . History of bronchitis 2019  . History of hiatal hernia   . History of kidney stones   . Hypertension    not on med yet  . Pneumonia    bacterial in early 20's ,  . Wears glasses    Past Surgical History:  Procedure Laterality Date  . COLONOSCOPY    . CYSTOSCOPY  06/14/2017   in office  . CYSTOSCOPY N/A 06/25/2017   Procedure: CYSTOSCOPY;  Surgeon: Malen GauzeMcKenzie, Ahkeem Goede L, MD;  [facility] [place];  Service: Urology;  Laterality: N/A;  . CYSTOSCOPY WITH INSERTION OF UROLIFT N/A 06/25/2017   Procedure: CYSTOSCOPY WITH INSERTION OF UROLIFT;  Surgeon: Malen GauzeMcKenzie, Kentley Blyden L, MD;  [facility] [place];  Service: Urology;  Laterality: N/A;  . CYSTOSCOPY WITH LITHOLAPAXY N/A 06/25/2017   Procedure: CYSTOSCOPY WITH LITHOLAPAXY;  Surgeon: Malen GauzeMcKenzie, Zaineb Nowaczyk L, MD;  [facility] [place];  Service: Urology;  Laterality: N/A;  . ESOPHAGEAL DILATION    . EXTRACORPOREAL SHOCK WAVE LITHOTRIPSY Left 05/17/2017   Procedure: LEFT EXTRACORPOREAL SHOCK WAVE LITHOTRIPSY (ESWL);  Surgeon: Malen GauzeMcKenzie, Rockelle Heuerman L, MD;  Location: [facility];  Service: Urology;  Laterality: Left;  . EXTRACORPOREAL SHOCK WAVE LITHOTRIPSY Left 05/24/2017   Procedure: LEFT EXTRACORPOREAL SHOCK WAVE LITHOTRIPSY (ESWL);  Surgeon: Malen GauzeMcKenzie, Gavriel Holzhauer L, MD;  Location: [facility];  Service: Urology;   Laterality: Left;  . HOLMIUM LASER APPLICATION N/A 06/25/2017   Procedure: HOLMIUM LASER APPLICATION;  Surgeon: Malen GauzeMcKenzie, Herson Prichard L, MD;  [facility] [place];  Service: Urology;  Laterality: N/A;  . TONSILLECTOMY     age 89    Home Medications:  Current Facility-Administered Medications  Medication Dose Route Frequency Provider Last Rate Last Dose  . ceFAZolin (ANCEF) IVPB 2g/100 mL premix  2 g Intravenous 30 min Pre-Op Ronne BinningMcKenzie, Mardene CelestePatrick L, MD      . lactated ringers infusion   Intravenous Continuous Eilene Ghaziose, George, MD 50 mL/hr at 03/03/19 1345     Allergies:  Allergies  Allergen Reactions  . Other     Cantalope: throat itching occ Beer: nasal congestion, throat swelling    History reviewed. No pertinent family history. Social History:  reports that he has never smoked. He has never used smokeless tobacco. He reports current alcohol use. He reports that he does not use drugs.  Review of Systems  All other systems reviewed and are negative.   Physical Exam:  Vital signs in last 24 hours: Temp:  [98.2 F (36.8 C)] 98.2 F (36.8 C) (06/15 1330) Pulse Rate:  [87] 87 (06/15 1330) Resp:  [16] 16 (06/15 1330) BP: (151)/(81) 151/81 (06/15 1330) SpO2:  [98 %] 98 % (06/15 1330) Weight:  [102.2 kg] 102.2 kg (06/15 1330) Physical Exam  Constitutional: He is oriented to person, place, and  time. He appears well-developed and well-nourished.  HENT:  Head: Normocephalic and atraumatic.  Eyes: Pupils are equal, round, and reactive to light. EOM are normal.  Neck: Normal range of motion. No thyromegaly present.  Cardiovascular: Normal rate and regular rhythm.  Respiratory: Effort normal. No respiratory distress.  GI: Soft. He exhibits no distension.  Musculoskeletal: Normal range of motion.        General: No edema.  Neurological: He is alert and oriented to person, place, and time.  Skin: Skin is warm and dry.  Psychiatric: He has a normal mood and affect. His behavior  is normal. Judgment and thought content normal.    Laboratory Data:  Results for orders placed or performed during the hospital encounter of 03/03/19 (from the past 24 hour(s))  I-STAT 4, (NA,K, GLUC, HGB,HCT)     Status: Abnormal   Collection Time: 03/03/19  1:45 PM  Result Value Ref Range   Sodium 141 135 - 145 mmol/L   Potassium 3.9 3.5 - 5.1 mmol/L   Glucose, Bld 124 (H) 70 - 99 mg/dL   HCT 51.0 39.0 - 52.0 %   Hemoglobin 17.3 (H) 13.0 - 17.0 g/dL   Recent Results (from the past 240 hour(s))  Novel Coronavirus, NAA (hospital order; send-out to ref lab)     Status: None   Collection Time: 02/27/19  3:13 PM   Specimen: Nasopharyngeal Swab; Respiratory  Result Value Ref Range Status   SARS-CoV-2, NAA NOT DETECTED NOT DETECTED Final    Comment: (NOTE) This test was developed and its performance characteristics determined by [employer]. This test has not been FDA cleared or approved. This test has been authorized by FDA under an Emergency Use Authorization (EUA). This test is only authorized for the duration of time the declaration that circumstances exist justifying the authorization of the emergency use of in vitro diagnostic tests for detection of SARS-CoV-2 virus and/or diagnosis of COVID-19 infection under section 564(b)(1) of the Act, 21 U.S.C. 267TIW-5(Y)(0), unless the authorization is terminated or revoked sooner. When diagnostic testing is negative, the possibility of a false negative result should be considered in the context of a patient's recent exposures and the presence of clinical signs and symptoms consistent with COVID-19. An individual without symptoms of COVID-19 and who is not shedding SARS-CoV-2 virus would expect to have a negative (not detected) result in this assay. Performed  At: [facility] [address] [place], [place] [id number] Rush Farmer MD NL:[id number]    [place]  Final    Comment: Performed  at [facility], [place] [address], [place], [place] [address]   Creatinine: No results for input(s): CREATININE in the last 168 hours. Baseline Creatinine: unknown   Impression/Assessment:  63yo with penile sebaceous cyst  Plan:  The risks/benefits/alternatives to penile cyst excision was explained to the patient and he understands and wishes to proceed with surgery  Nicolette Bang 03/03/2019, 4:00 PM

## 2019-03-04 ENCOUNTER — Encounter (HOSPITAL_BASED_OUTPATIENT_CLINIC_OR_DEPARTMENT_OTHER): Payer: Self-pay | Admitting: Urology

## 2020-04-26 ENCOUNTER — Ambulatory Visit: Payer: BC Managed Care – PPO

## 2020-10-11 IMAGING — US US ABDOMEN LIMITED
1 series · 14 of 25 positions shown · non-contrast
Comparison: 04/22/2017 CT

CLINICAL DATA: 62-year-old male with elevated LFTs.

EXAM:
ULTRASOUND ABDOMEN LIMITED RIGHT UPPER QUADRANT

[Series 1: us abdomen limited · 0.14mm/px · 14 of 44 slices shown]
[im 1/44]
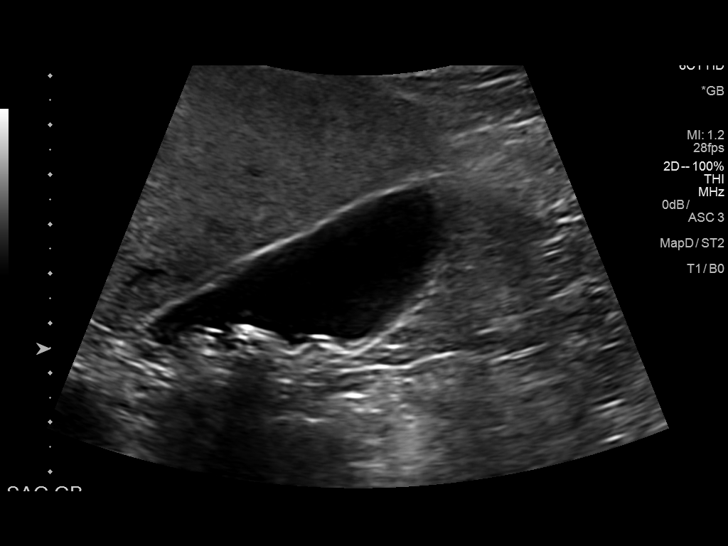
[im 4/44]
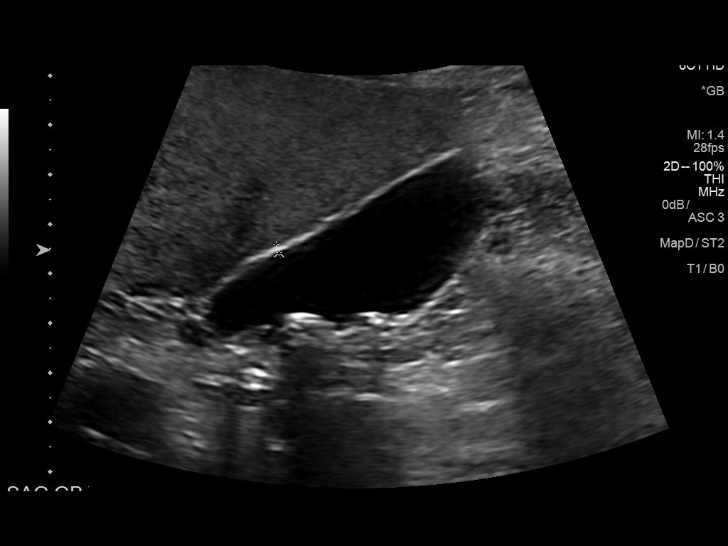
[im 8/44]
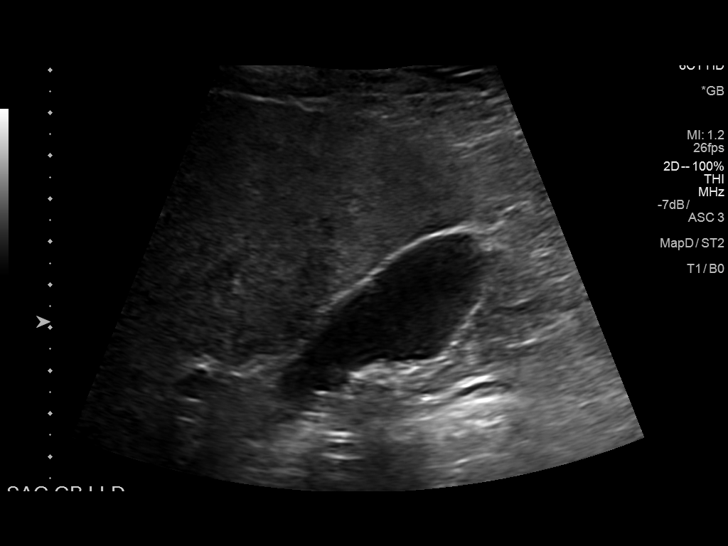
[im 11/44]
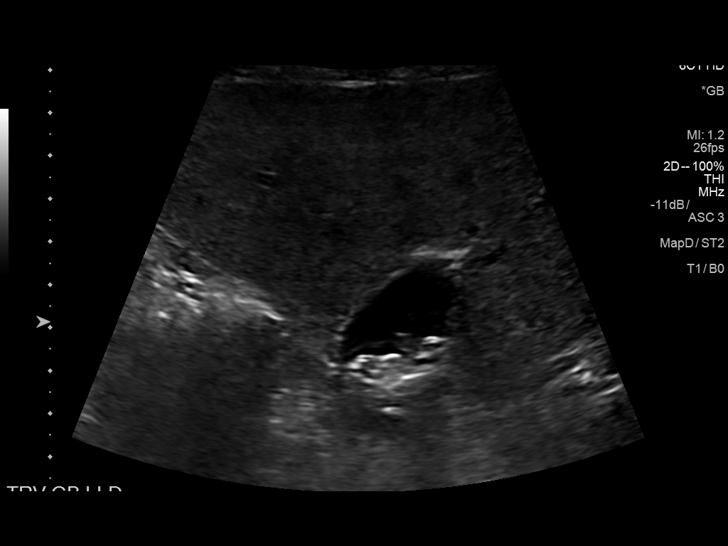
[im 15/44]
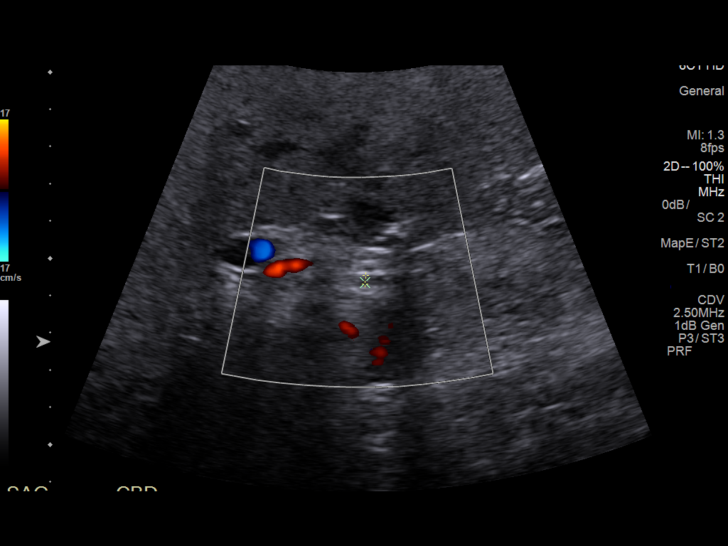
[im 17/44]
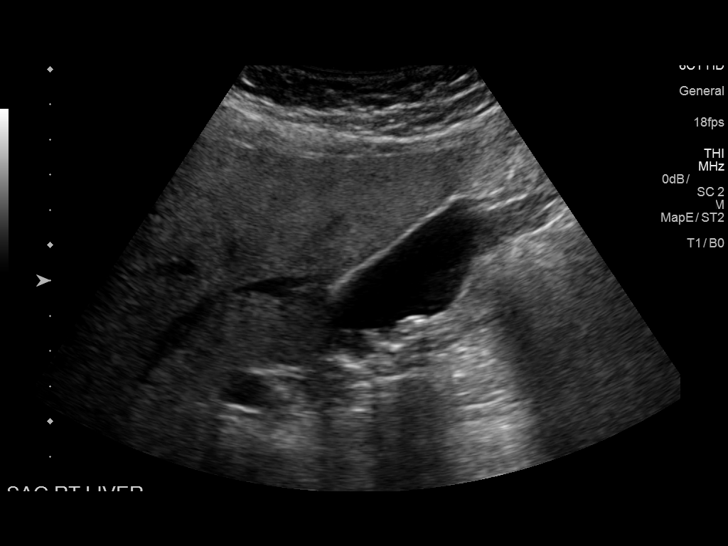
[im 20/44]
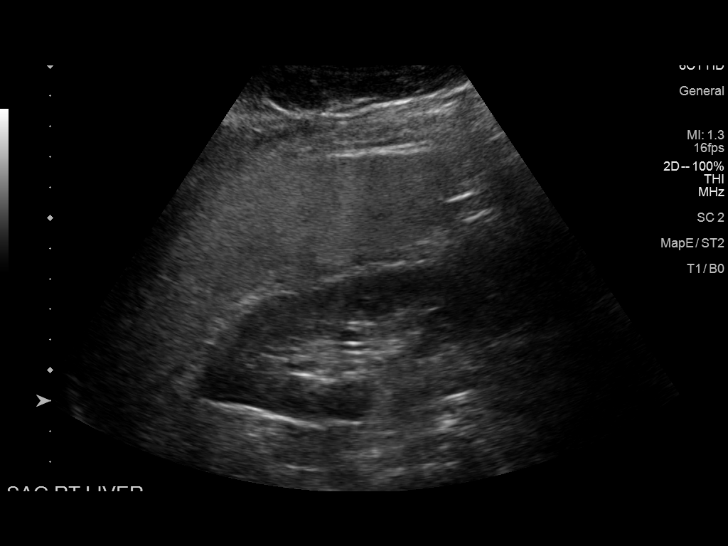
[im 24/44]
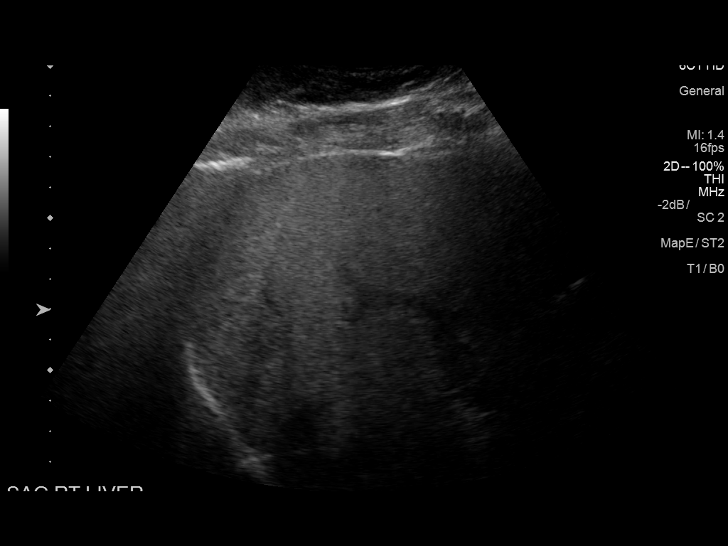
[im 27/44]
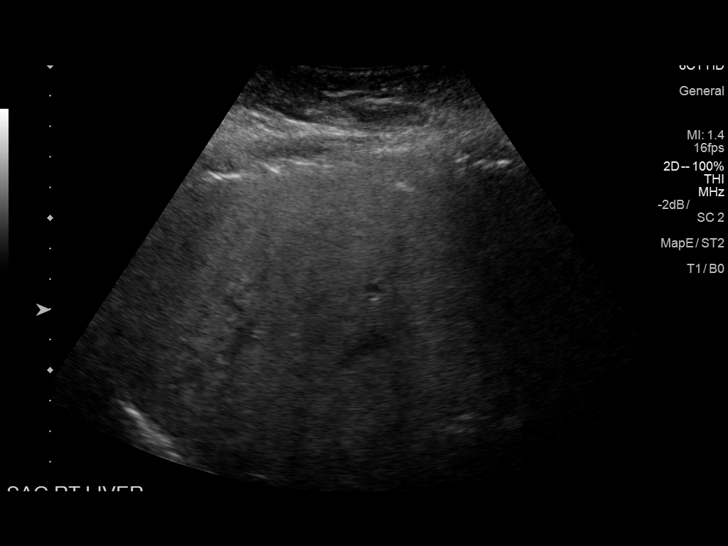
[im 29/44]
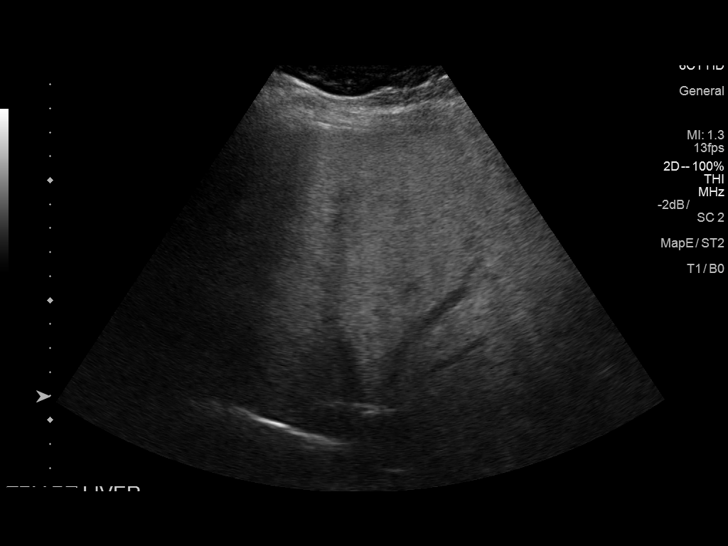
[im 33/44]
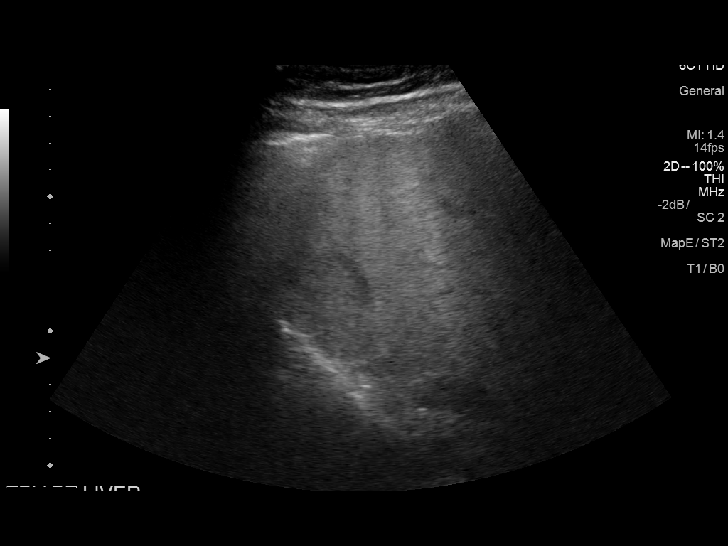
[im 36/44]
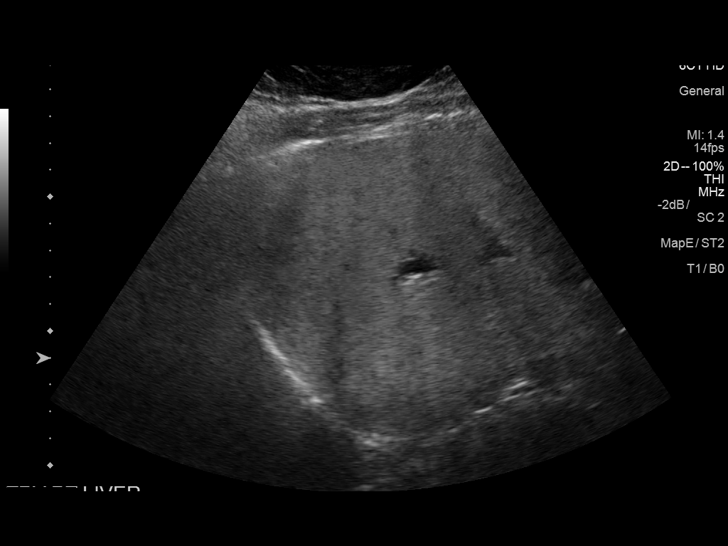
[im 40/44]
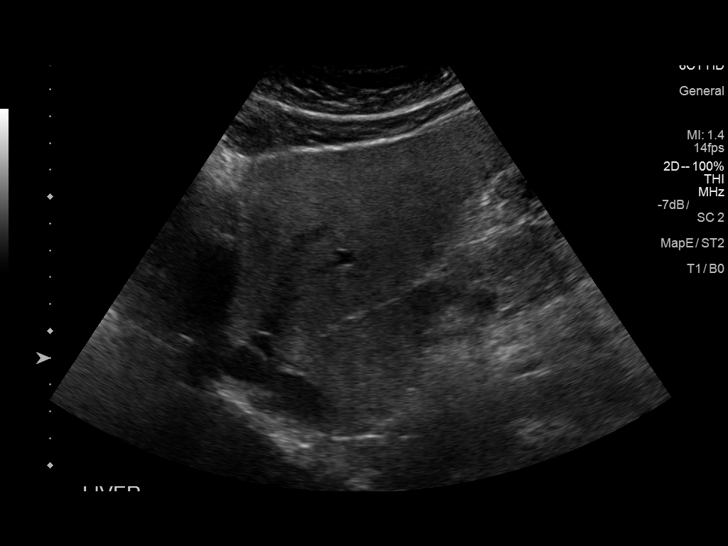
[im 44/44]
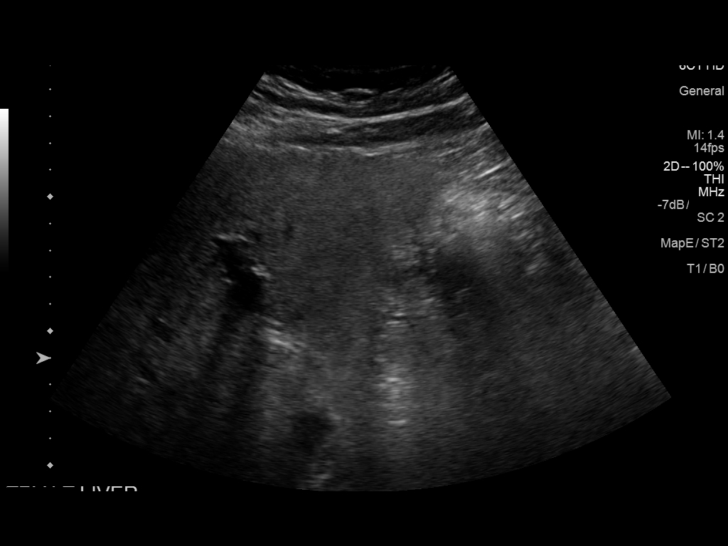

[14 of 25 positions shown; findings below may reference images not displayed]

FINDINGS: Gallbladder:

The largest multiple mobile gallstones measuring 1.2 cm. There are
identified, is no evidence of gallbladder wall thickening,
pericholecystic fluid or sonographic Murphy sign.

Common bile duct:

Diameter: 2.3 mm. No intrahepatic or extrahepatic biliary
dilatation.

Liver:

Diffusely increased hepatic echogenicity is compatible with hepatic
steatosis. No focal hepatic abnormalities are present. Portal vein
is patent on color Doppler imaging with normal direction of blood
flow towards the liver.

A 2.3 cm RIGHT renal cyst is unchanged from prior CT
IMPRESSION: 1. Hepatic steatosis.
2. Cholelithiasis without CT evidence of acute cholecystitis. No
biliary dilatation.

## 2021-04-05 IMAGING — CR LEFT FOOT - COMPLETE 3+ VIEW
3 series · 3 of 3 positions shown · non-contrast
Comparison: None.

CLINICAL DATA: Left foot pain for 2 weeks without known injury.

EXAM:
LEFT FOOT - COMPLETE 3+ VIEW

[x foot ap left]
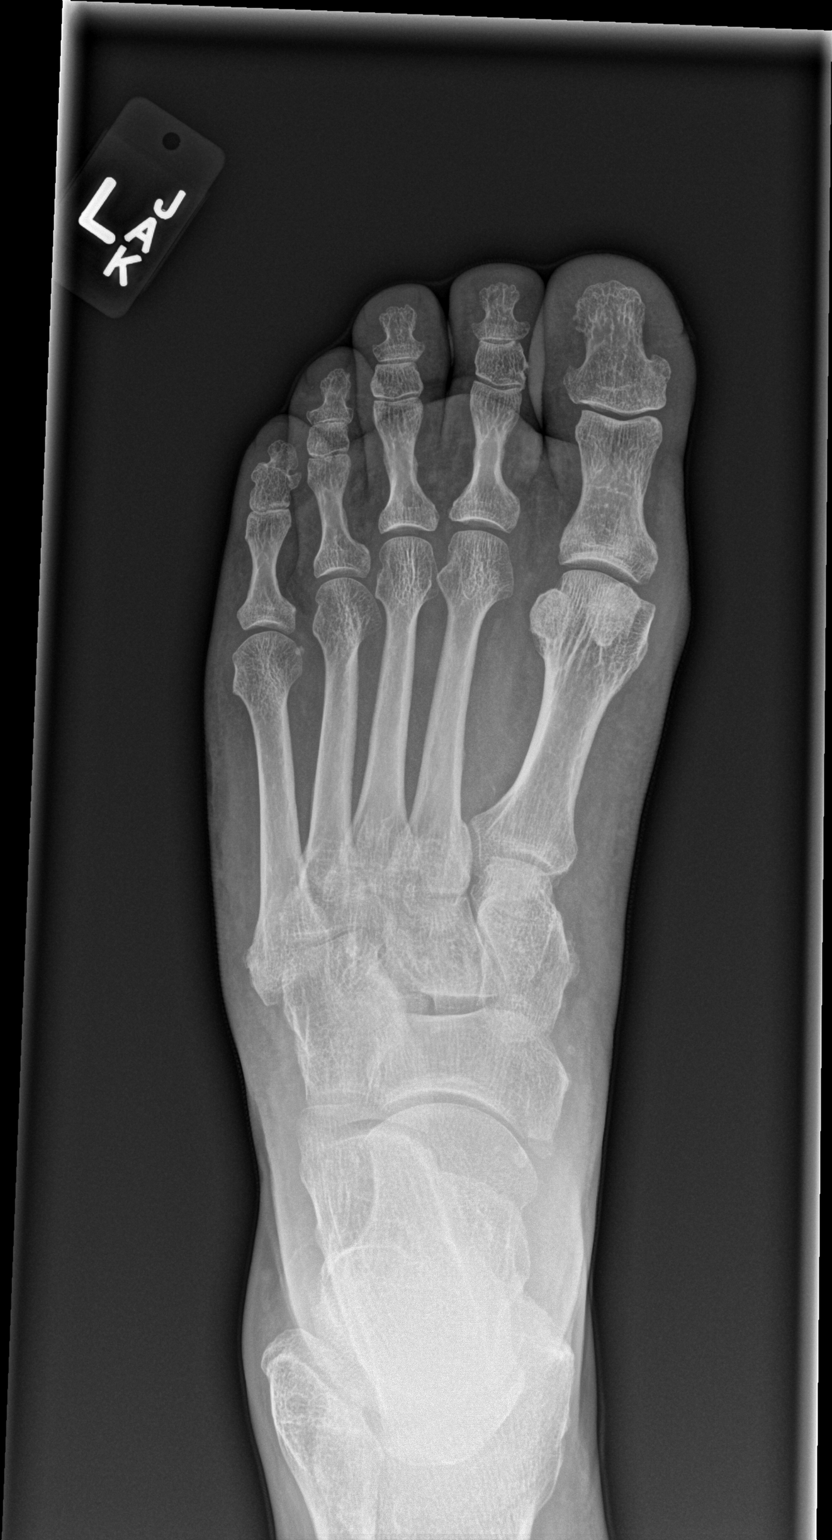

[x foot obl left]
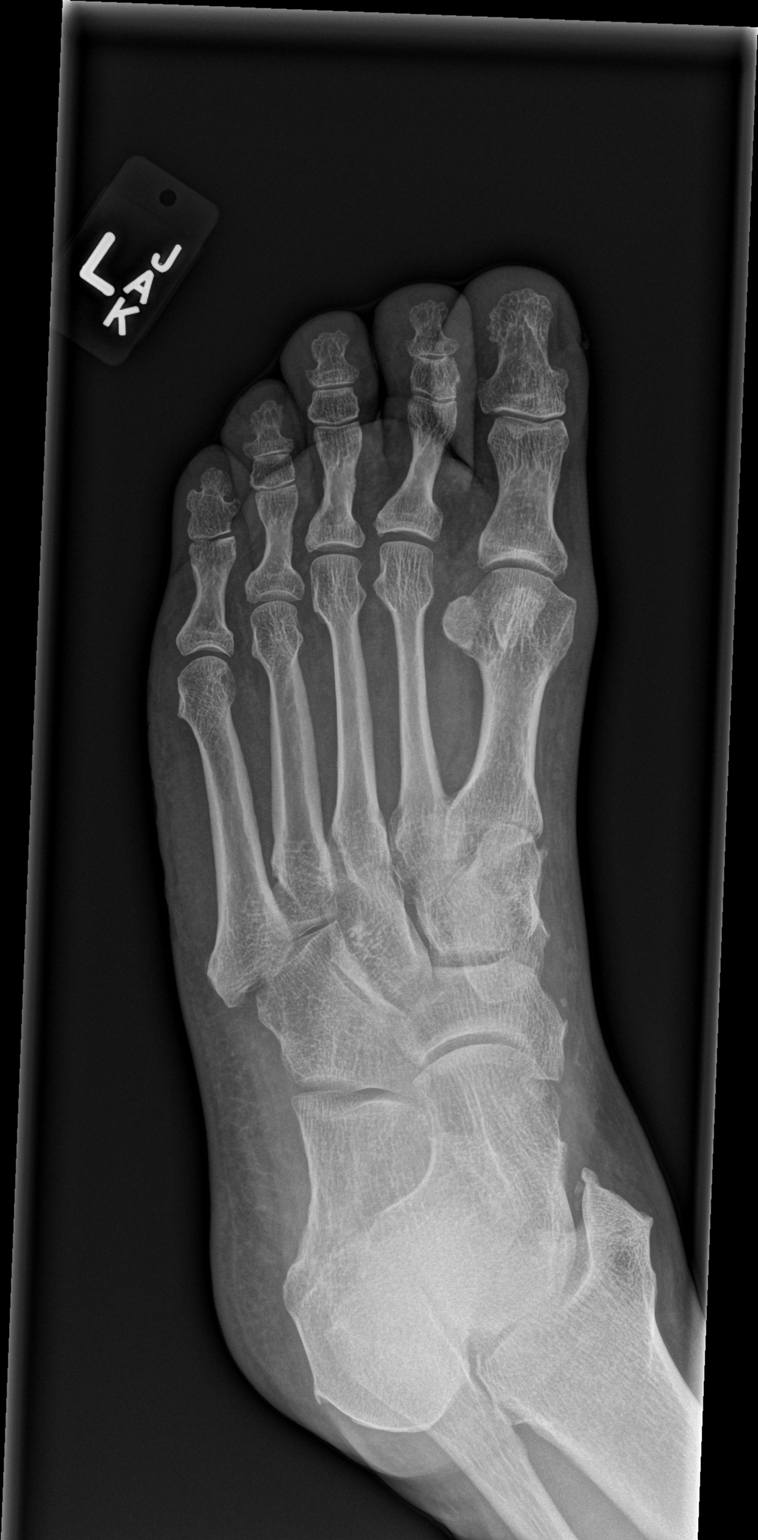

[x foot lat left]
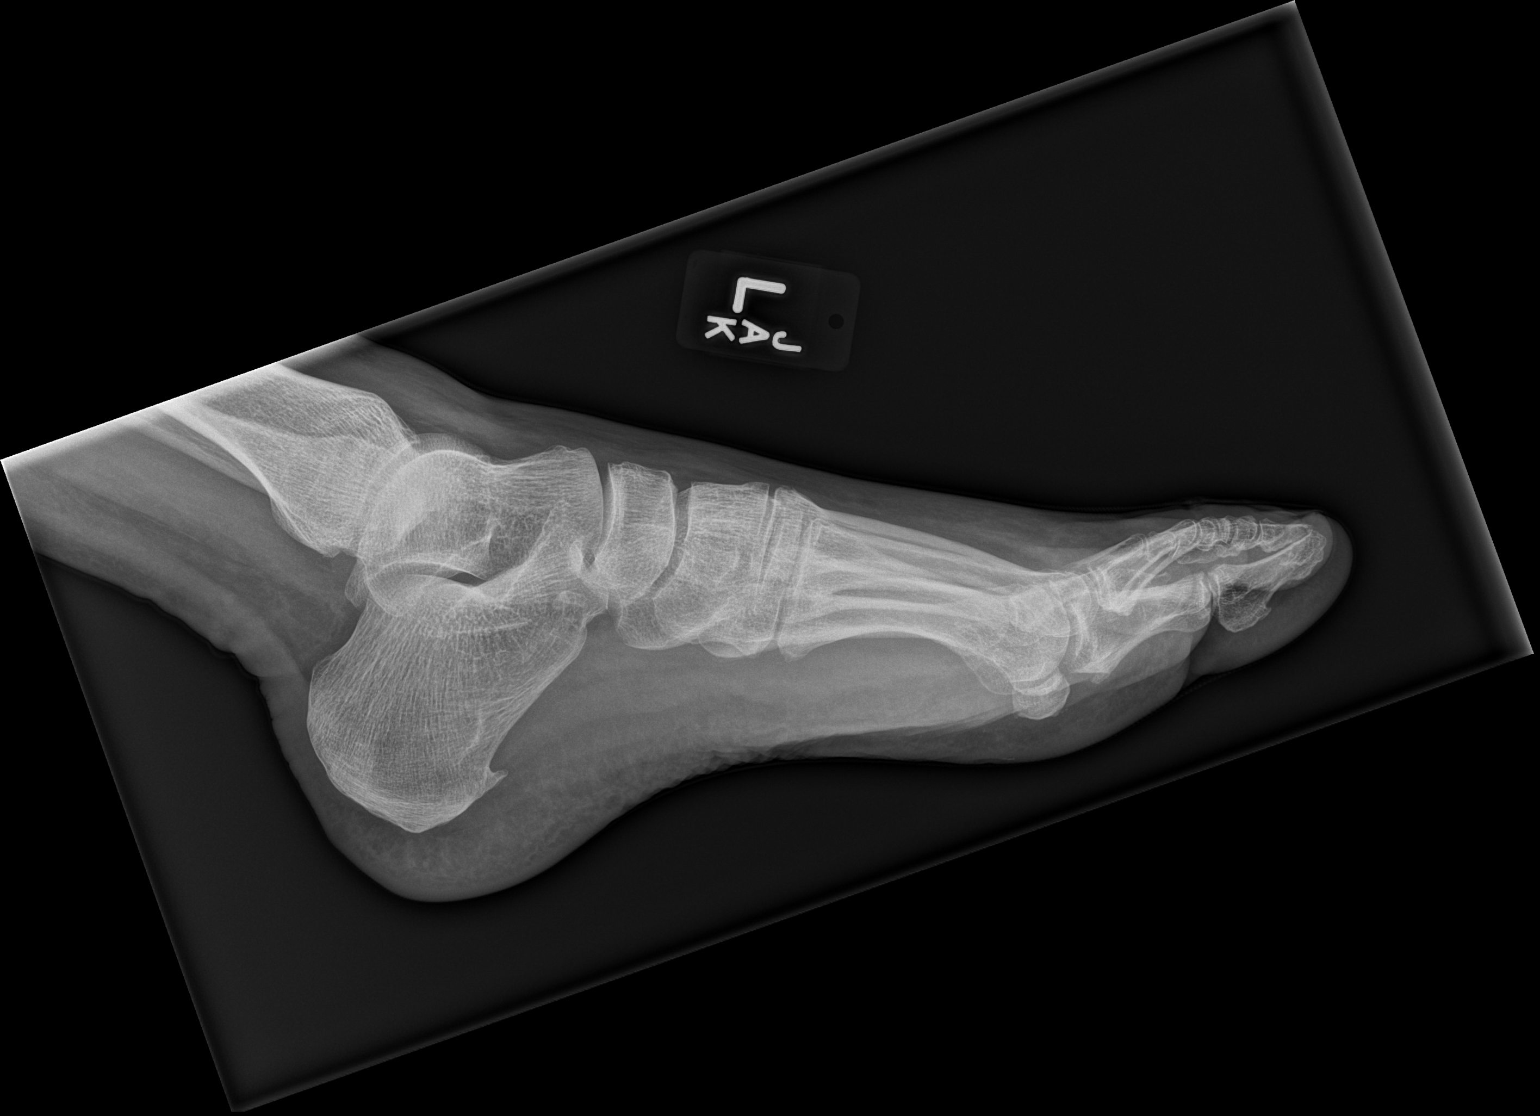

[3 of 3 positions shown; findings below may reference images not displayed]

FINDINGS: There is no evidence of fracture or dislocation. There is no
evidence of arthropathy or other focal bone abnormality. Soft
tissues are unremarkable.
IMPRESSION: Negative.
